# Patient Record
Sex: Male | Born: 1955 | Race: White | Hispanic: No | Marital: Married | Smoking: Former smoker
Health system: Southern US, Community
[De-identification: ages and names within clinical notes are randomized; demographics above are authoritative.]

## PROBLEM LIST (undated history)

## (undated) DIAGNOSIS — C801 Malignant (primary) neoplasm, unspecified: Secondary | ICD-10-CM

## (undated) DIAGNOSIS — C6290 Malignant neoplasm of unspecified testis, unspecified whether descended or undescended: Secondary | ICD-10-CM

## (undated) DIAGNOSIS — Z9889 Other specified postprocedural states: Secondary | ICD-10-CM

## (undated) DIAGNOSIS — F32A Depression, unspecified: Secondary | ICD-10-CM

## (undated) DIAGNOSIS — R112 Nausea with vomiting, unspecified: Secondary | ICD-10-CM

## (undated) DIAGNOSIS — K219 Gastro-esophageal reflux disease without esophagitis: Secondary | ICD-10-CM

## (undated) DIAGNOSIS — F329 Major depressive disorder, single episode, unspecified: Secondary | ICD-10-CM

## (undated) DIAGNOSIS — F419 Anxiety disorder, unspecified: Secondary | ICD-10-CM

## (undated) HISTORY — PX: BACK SURGERY: SHX140

## (undated) HISTORY — PX: JOINT REPLACEMENT: SHX530

## (undated) HISTORY — PX: OTHER SURGICAL HISTORY: SHX169

## (undated) HISTORY — DX: Malignant neoplasm of unspecified testis, unspecified whether descended or undescended: C62.90

## (undated) HISTORY — PX: KNEE ARTHROSCOPY: SUR90

## (undated) HISTORY — PX: NOSE SURGERY: SHX723

---

## 2012-06-30 ENCOUNTER — Other Ambulatory Visit: Payer: Self-pay | Admitting: Neurological Surgery

## 2012-07-13 ENCOUNTER — Encounter (HOSPITAL_COMMUNITY): Payer: Self-pay | Admitting: Pharmacy Technician

## 2012-07-19 ENCOUNTER — Ambulatory Visit (HOSPITAL_COMMUNITY)
Admission: RE | Admit: 2012-07-19 | Discharge: 2012-07-19 | Disposition: A | Discharge status: Home / Self Care | Payer: 59 | Source: Ambulatory Visit | Attending: Neurological Surgery | Admitting: Neurological Surgery

## 2012-07-19 ENCOUNTER — Encounter (HOSPITAL_COMMUNITY)
Admission: RE | Admit: 2012-07-19 | Discharge: 2012-07-19 | Disposition: A | Discharge status: Home / Self Care | Payer: 59 | Source: Ambulatory Visit | Attending: Neurological Surgery | Admitting: Neurological Surgery

## 2012-07-19 ENCOUNTER — Encounter (HOSPITAL_COMMUNITY): Payer: Self-pay

## 2012-07-19 DIAGNOSIS — Z01812 Encounter for preprocedural laboratory examination: Secondary | ICD-10-CM | POA: Insufficient documentation

## 2012-07-19 DIAGNOSIS — M47814 Spondylosis without myelopathy or radiculopathy, thoracic region: Secondary | ICD-10-CM | POA: Insufficient documentation

## 2012-07-19 DIAGNOSIS — Z01818 Encounter for other preprocedural examination: Secondary | ICD-10-CM | POA: Insufficient documentation

## 2012-07-19 HISTORY — DX: Other specified postprocedural states: Z98.890

## 2012-07-19 HISTORY — DX: Gastro-esophageal reflux disease without esophagitis: K21.9

## 2012-07-19 HISTORY — DX: Major depressive disorder, single episode, unspecified: F32.9

## 2012-07-19 HISTORY — DX: Depression, unspecified: F32.A

## 2012-07-19 HISTORY — DX: Nausea with vomiting, unspecified: R11.2

## 2012-07-19 HISTORY — DX: Malignant (primary) neoplasm, unspecified: C80.1

## 2012-07-19 HISTORY — DX: Anxiety disorder, unspecified: F41.9

## 2012-07-19 LAB — PROTIME-INR
INR: 1 (ref 0.00–1.49)
Prothrombin Time: 13 seconds (ref 11.6–15.2)

## 2012-07-19 LAB — CBC WITH DIFFERENTIAL/PLATELET
Eosinophils Absolute: 0.2 10*3/uL (ref 0.0–0.7)
Eosinophils Relative: 2 % (ref 0–5)
Hemoglobin: 12.9 g/dL — ABNORMAL LOW (ref 13.0–17.0)
Lymphocytes Relative: 29 % (ref 12–46)
Lymphs Abs: 2.7 10*3/uL (ref 0.7–4.0)
MCH: 29 pg (ref 26.0–34.0)
MCV: 83.4 fL (ref 78.0–100.0)
Monocytes Relative: 6 % (ref 3–12)
Platelets: 286 10*3/uL (ref 150–400)
RBC: 4.45 MIL/uL (ref 4.22–5.81)
WBC: 9.4 10*3/uL (ref 4.0–10.5)

## 2012-07-19 LAB — BASIC METABOLIC PANEL
CO2: 24 mEq/L (ref 19–32)
Calcium: 9 mg/dL (ref 8.4–10.5)
Chloride: 103 mEq/L (ref 96–112)
Glucose, Bld: 95 mg/dL (ref 70–99)
Potassium: 4.3 mEq/L (ref 3.5–5.1)
Sodium: 138 mEq/L (ref 135–145)

## 2012-07-19 LAB — SURGICAL PCR SCREEN: Staphylococcus aureus: NEGATIVE

## 2012-07-19 NOTE — Pre-Procedure Instructions (Signed)
Cayne Yom [facility]  07/19/2012   Your procedure is scheduled on:  07/27/12  Report to [facility] at 1054 AM.  Call this number if you have problems the morning of surgery: [phone]   Remember:   Do not eat food or drink liquids after midnight.   Take these medicines the morning of surgery with A SIP OF WATER: prozac,hydrocodone,prilosec,zoloft   Do not wear jewelry, make-up or nail polish.  Do not wear lotions, powders, or perfumes. You may wear deodorant.  Do not shave 48 hours prior to surgery. Men may shave face and neck.  Do not bring valuables to the hospital.  [facility] is not responsible                   for any belongings or valuables.  Contacts, dentures or bridgework may not be worn into surgery.  Leave suitcase in the car. After surgery it may be brought to your room.  For patients admitted to the hospital, checkout time is 11:00 AM the day of  discharge.   Patients discharged the day of surgery will not be allowed to drive  home.  Name and phone number of your driver: family  Special Instructions: Shower using CHG 2 nights before surgery and the night before surgery.  If you shower the day of surgery use CHG.  Use special wash - you have one bottle of CHG for all showers.  You should use approximately 1/3 of the bottle for each shower.   Please read over the following fact sheets that you were given: Pain Booklet, Coughing and Deep Breathing and Surgical Site Infection Prevention

## 2012-07-26 MED ORDER — DEXAMETHASONE SODIUM PHOSPHATE 10 MG/ML IJ SOLN
10.0000 mg | INTRAMUSCULAR | Status: AC
  Administered 2012-07-27: 10 mg via INTRAVENOUS
  Filled 2012-07-26: qty 1

## 2012-07-26 MED ORDER — CEFAZOLIN SODIUM-DEXTROSE 2-3 GM-% IV SOLR
2.0000 g | INTRAVENOUS | Status: AC
  Administered 2012-07-27: 2 g via INTRAVENOUS
  Filled 2012-07-26: qty 50

## 2012-07-27 ENCOUNTER — Encounter (HOSPITAL_COMMUNITY): Payer: Self-pay | Admitting: Certified Registered Nurse Anesthetist

## 2012-07-27 ENCOUNTER — Encounter (HOSPITAL_COMMUNITY): Payer: Self-pay | Admitting: *Deleted

## 2012-07-27 ENCOUNTER — Ambulatory Visit (HOSPITAL_COMMUNITY)
Admission: RE | Admit: 2012-07-27 | Discharge: 2012-07-28 | Disposition: A | Discharge status: Home / Self Care | Payer: 59 | Source: Ambulatory Visit | Attending: Neurological Surgery | Admitting: Neurological Surgery

## 2012-07-27 ENCOUNTER — Ambulatory Visit (HOSPITAL_COMMUNITY): Payer: 59

## 2012-07-27 ENCOUNTER — Encounter (HOSPITAL_COMMUNITY)
Admission: RE | Disposition: A | Discharge status: Home / Self Care | Payer: Self-pay | Source: Ambulatory Visit | Attending: Neurological Surgery

## 2012-07-27 ENCOUNTER — Ambulatory Visit (HOSPITAL_COMMUNITY): Payer: 59 | Admitting: Certified Registered Nurse Anesthetist

## 2012-07-27 DIAGNOSIS — M47812 Spondylosis without myelopathy or radiculopathy, cervical region: Secondary | ICD-10-CM | POA: Insufficient documentation

## 2012-07-27 DIAGNOSIS — M5020 Other cervical disc displacement, unspecified cervical region: Secondary | ICD-10-CM | POA: Insufficient documentation

## 2012-07-27 DIAGNOSIS — Z981 Arthrodesis status: Secondary | ICD-10-CM | POA: Insufficient documentation

## 2012-07-27 HISTORY — PX: ANTERIOR CERVICAL DECOMP/DISCECTOMY FUSION: SHX1161

## 2012-07-27 SURGERY — ANTERIOR CERVICAL DECOMPRESSION/DISCECTOMY FUSION 1 LEVEL/HARDWARE REMOVAL
Anesthesia: General | Wound class: Clean

## 2012-07-27 MED ORDER — PHENOL 1.4 % MT LIQD: 1.0000 | OROMUCOSAL | Status: DC | PRN

## 2012-07-27 MED ORDER — METHOCARBAMOL 100 MG/ML IJ SOLN
500.0000 mg | Freq: Four times a day (QID) | INTRAVENOUS | Status: DC | PRN
  Filled 2012-07-27: qty 5

## 2012-07-27 MED ORDER — EPHEDRINE SULFATE 50 MG/ML IJ SOLN
INTRAMUSCULAR | Status: DC | PRN
  Administered 2012-07-27 (×3): 5 mg via INTRAVENOUS

## 2012-07-27 MED ORDER — DEXAMETHASONE 4 MG PO TABS
4.0000 mg | ORAL_TABLET | Freq: Four times a day (QID) | ORAL | Status: DC
  Administered 2012-07-27 – 2012-07-28 (×2): 4 mg via ORAL
  Filled 2012-07-27 (×7): qty 1

## 2012-07-27 MED ORDER — ONDANSETRON HCL 4 MG/2ML IJ SOLN: 4.0000 mg | INTRAMUSCULAR | Status: DC | PRN

## 2012-07-27 MED ORDER — BUPIVACAINE HCL (PF) 0.25 % IJ SOLN
INTRAMUSCULAR | Status: DC | PRN
  Administered 2012-07-27: 2 mL

## 2012-07-27 MED ORDER — PROPOFOL 10 MG/ML IV BOLUS
INTRAVENOUS | Status: DC | PRN
  Administered 2012-07-27: 150 mg via INTRAVENOUS

## 2012-07-27 MED ORDER — ONDANSETRON HCL 4 MG/2ML IJ SOLN
INTRAMUSCULAR | Status: DC | PRN
  Administered 2012-07-27: 4 mg via INTRAVENOUS

## 2012-07-27 MED ORDER — SODIUM CHLORIDE 0.9 % IJ SOLN: 3.0000 mL | INTRAMUSCULAR | Status: DC | PRN

## 2012-07-27 MED ORDER — PROMETHAZINE HCL 25 MG/ML IJ SOLN: 6.2500 mg | INTRAMUSCULAR | Status: DC | PRN

## 2012-07-27 MED ORDER — OXYCODONE HCL 5 MG PO TABS: 5.0000 mg | ORAL_TABLET | Freq: Once | ORAL | Status: DC | PRN

## 2012-07-27 MED ORDER — DEXAMETHASONE SODIUM PHOSPHATE 4 MG/ML IJ SOLN
4.0000 mg | Freq: Four times a day (QID) | INTRAMUSCULAR | Status: DC
  Administered 2012-07-27: 4 mg via INTRAVENOUS
  Filled 2012-07-27 (×5): qty 1

## 2012-07-27 MED ORDER — METHOCARBAMOL 500 MG PO TABS
500.0000 mg | ORAL_TABLET | Freq: Four times a day (QID) | ORAL | Status: DC | PRN
  Administered 2012-07-27: 500 mg via ORAL

## 2012-07-27 MED ORDER — ARTIFICIAL TEARS OP OINT
TOPICAL_OINTMENT | OPHTHALMIC | Status: DC | PRN
  Administered 2012-07-27: 1 via OPHTHALMIC

## 2012-07-27 MED ORDER — PHENYLEPHRINE HCL 10 MG/ML IJ SOLN
INTRAMUSCULAR | Status: DC | PRN
  Administered 2012-07-27 (×5): 80 ug via INTRAVENOUS

## 2012-07-27 MED ORDER — MENTHOL 3 MG MT LOZG: 1.0000 | LOZENGE | OROMUCOSAL | Status: DC | PRN

## 2012-07-27 MED ORDER — BACITRACIN 50000 UNITS IM SOLR
INTRAMUSCULAR | Status: AC
  Filled 2012-07-27: qty 1

## 2012-07-27 MED ORDER — HYDROMORPHONE HCL PF 1 MG/ML IJ SOLN
INTRAMUSCULAR | Status: AC
  Administered 2012-07-27: 1 mg
  Filled 2012-07-27: qty 1

## 2012-07-27 MED ORDER — FLUOXETINE HCL 20 MG PO CAPS
20.0000 mg | ORAL_CAPSULE | Freq: Every day | ORAL | Status: DC
  Filled 2012-07-27 (×2): qty 1

## 2012-07-27 MED ORDER — LIDOCAINE HCL (CARDIAC) 20 MG/ML IV SOLN
INTRAVENOUS | Status: DC | PRN
  Administered 2012-07-27: 100 mg via INTRAVENOUS

## 2012-07-27 MED ORDER — SODIUM CHLORIDE 0.9 % IJ SOLN
3.0000 mL | Freq: Two times a day (BID) | INTRAMUSCULAR | Status: DC
  Administered 2012-07-27: 3 mL via INTRAVENOUS

## 2012-07-27 MED ORDER — THROMBIN 5000 UNITS EX SOLR
CUTANEOUS | Status: DC | PRN
  Administered 2012-07-27 (×3): 5000 [IU] via TOPICAL

## 2012-07-27 MED ORDER — POTASSIUM CHLORIDE IN NACL 20-0.9 MEQ/L-% IV SOLN
INTRAVENOUS | Status: DC
  Administered 2012-07-27: 18:00:00 via INTRAVENOUS
  Filled 2012-07-27 (×3): qty 1000

## 2012-07-27 MED ORDER — FENTANYL CITRATE 0.05 MG/ML IJ SOLN
INTRAMUSCULAR | Status: DC | PRN
  Administered 2012-07-27 (×2): 50 ug via INTRAVENOUS
  Administered 2012-07-27: 100 ug via INTRAVENOUS
  Administered 2012-07-27: 150 ug via INTRAVENOUS
  Administered 2012-07-27: 50 ug via INTRAVENOUS

## 2012-07-27 MED ORDER — MORPHINE SULFATE 2 MG/ML IJ SOLN: 1.0000 mg | INTRAMUSCULAR | Status: DC | PRN

## 2012-07-27 MED ORDER — METHOCARBAMOL 500 MG PO TABS
ORAL_TABLET | ORAL | Status: AC
  Filled 2012-07-27: qty 1

## 2012-07-27 MED ORDER — SODIUM CHLORIDE 0.9 % IR SOLN
Status: DC | PRN
  Administered 2012-07-27: 13:00:00

## 2012-07-27 MED ORDER — GLYCOPYRROLATE 0.2 MG/ML IJ SOLN
INTRAMUSCULAR | Status: DC | PRN
  Administered 2012-07-27: 0.6 mg via INTRAVENOUS

## 2012-07-27 MED ORDER — LIDOCAINE HCL 4 % MT SOLN
OROMUCOSAL | Status: DC | PRN
  Administered 2012-07-27: 4 mL via TOPICAL

## 2012-07-27 MED ORDER — MIDAZOLAM HCL 5 MG/5ML IJ SOLN
INTRAMUSCULAR | Status: DC | PRN
  Administered 2012-07-27: 2 mg via INTRAVENOUS

## 2012-07-27 MED ORDER — NEOSTIGMINE METHYLSULFATE 1 MG/ML IJ SOLN
INTRAMUSCULAR | Status: DC | PRN
  Administered 2012-07-27: 4 mg via INTRAVENOUS

## 2012-07-27 MED ORDER — HEMOSTATIC AGENTS (NO CHARGE) OPTIME
TOPICAL | Status: DC | PRN
  Administered 2012-07-27: 1 via TOPICAL

## 2012-07-27 MED ORDER — LACTATED RINGERS IV SOLN
INTRAVENOUS | Status: DC | PRN
  Administered 2012-07-27 (×2): via INTRAVENOUS

## 2012-07-27 MED ORDER — ACETAMINOPHEN 650 MG RE SUPP: 650.0000 mg | RECTAL | Status: DC | PRN

## 2012-07-27 MED ORDER — HYDROCODONE-ACETAMINOPHEN 5-325 MG PO TABS
1.0000 | ORAL_TABLET | ORAL | Status: DC | PRN
  Administered 2012-07-27: 2 via ORAL

## 2012-07-27 MED ORDER — HYDROMORPHONE HCL PF 1 MG/ML IJ SOLN: 0.2500 mg | INTRAMUSCULAR | Status: DC | PRN

## 2012-07-27 MED ORDER — ALBUTEROL SULFATE HFA 108 (90 BASE) MCG/ACT IN AERS
INHALATION_SPRAY | RESPIRATORY_TRACT | Status: DC | PRN
  Administered 2012-07-27: 6 via RESPIRATORY_TRACT

## 2012-07-27 MED ORDER — VECURONIUM BROMIDE 10 MG IV SOLR
INTRAVENOUS | Status: DC | PRN
  Administered 2012-07-27: 3 mg via INTRAVENOUS
  Administered 2012-07-27: 2 mg via INTRAVENOUS

## 2012-07-27 MED ORDER — SERTRALINE HCL 100 MG PO TABS
100.0000 mg | ORAL_TABLET | Freq: Every day | ORAL | Status: DC
  Filled 2012-07-27 (×2): qty 1

## 2012-07-27 MED ORDER — ROCURONIUM BROMIDE 100 MG/10ML IV SOLN
INTRAVENOUS | Status: DC | PRN
  Administered 2012-07-27: 50 mg via INTRAVENOUS

## 2012-07-27 MED ORDER — ACETAMINOPHEN 325 MG PO TABS: 650.0000 mg | ORAL_TABLET | ORAL | Status: DC | PRN

## 2012-07-27 MED ORDER — CEFAZOLIN SODIUM 1-5 GM-% IV SOLN
1.0000 g | Freq: Three times a day (TID) | INTRAVENOUS | Status: AC
  Administered 2012-07-27 – 2012-07-28 (×2): 1 g via INTRAVENOUS
  Filled 2012-07-27 (×2): qty 50

## 2012-07-27 MED ORDER — HYDROCODONE-ACETAMINOPHEN 5-325 MG PO TABS
ORAL_TABLET | ORAL | Status: AC
  Filled 2012-07-27: qty 2

## 2012-07-27 MED ORDER — SODIUM CHLORIDE 0.9 % IV SOLN
INTRAVENOUS | Status: AC
  Filled 2012-07-27: qty 500

## 2012-07-27 MED ORDER — 0.9 % SODIUM CHLORIDE (POUR BTL) OPTIME
TOPICAL | Status: DC | PRN
  Administered 2012-07-27: 1000 mL

## 2012-07-27 MED ORDER — OXYCODONE HCL 5 MG/5ML PO SOLN: 5.0000 mg | Freq: Once | ORAL | Status: DC | PRN

## 2012-07-27 SURGICAL SUPPLY — 51 items
BAG DECANTER FOR FLEXI CONT (MISCELLANEOUS) ×2 IMPLANT
BENZOIN TINCTURE PRP APPL 2/3 (GAUZE/BANDAGES/DRESSINGS) ×2 IMPLANT
BUR MATCHSTICK NEURO 3.0 LAGG (BURR) ×2 IMPLANT
CAGE LORDOTIC 8 SM PLUS (Cage) ×2 IMPLANT
CANISTER SUCTION 2500CC (MISCELLANEOUS) ×2 IMPLANT
CLOTH BEACON ORANGE TIMEOUT ST (SAFETY) ×2 IMPLANT
CONT SPEC 4OZ CLIKSEAL STRL BL (MISCELLANEOUS) ×2 IMPLANT
DRAPE C-ARM 42X72 X-RAY (DRAPES) ×4 IMPLANT
DRAPE LAPAROTOMY 100X72 PEDS (DRAPES) ×2 IMPLANT
DRAPE MICROSCOPE ZEISS OPMI (DRAPES) ×2 IMPLANT
DRAPE POUCH INSTRU U-SHP 10X18 (DRAPES) ×2 IMPLANT
DRESSING TELFA 8X3 (GAUZE/BANDAGES/DRESSINGS) ×2 IMPLANT
DRILL BIT HELIX (BIT) ×2 IMPLANT
DRSG OPSITE 4X5.5 SM (GAUZE/BANDAGES/DRESSINGS) ×2 IMPLANT
DURAPREP 6ML APPLICATOR 50/CS (WOUND CARE) ×2 IMPLANT
ELECT COATED BLADE 2.86 ST (ELECTRODE) ×2 IMPLANT
ELECT REM PT RETURN 9FT ADLT (ELECTROSURGICAL) ×2
ELECTRODE REM PT RTRN 9FT ADLT (ELECTROSURGICAL) ×1 IMPLANT
GAUZE SPONGE 4X4 16PLY XRAY LF (GAUZE/BANDAGES/DRESSINGS) IMPLANT
GLOVE BIO SURGEON STRL SZ8 (GLOVE) ×2 IMPLANT
GLOVE ECLIPSE 7.5 STRL STRAW (GLOVE) ×2 IMPLANT
GLOVE INDICATOR 7.0 STRL GRN (GLOVE) ×2 IMPLANT
GLOVE INDICATOR 8.0 STRL GRN (GLOVE) ×2 IMPLANT
GLOVE OPTIFIT SS 6.5 STRL BRWN (GLOVE) ×4 IMPLANT
GOWN BRE IMP SLV AUR LG STRL (GOWN DISPOSABLE) ×2 IMPLANT
GOWN BRE IMP SLV AUR XL STRL (GOWN DISPOSABLE) ×2 IMPLANT
GOWN STRL REIN 2XL LVL4 (GOWN DISPOSABLE) IMPLANT
HEAD HALTER (SOFTGOODS) IMPLANT
HEMOSTAT POWDER KIT SURGIFOAM (HEMOSTASIS) ×2 IMPLANT
KIT BASIN OR (CUSTOM PROCEDURE TRAY) ×2 IMPLANT
KIT ROOM TURNOVER OR (KITS) ×2 IMPLANT
NEEDLE HYPO 25X1 1.5 SAFETY (NEEDLE) ×2 IMPLANT
NEEDLE SPNL 20GX3.5 QUINCKE YW (NEEDLE) ×2 IMPLANT
NS IRRIG 1000ML POUR BTL (IV SOLUTION) ×2 IMPLANT
PACK LAMINECTOMY NEURO (CUSTOM PROCEDURE TRAY) ×2 IMPLANT
PAD ARMBOARD 7.5X6 YLW CONV (MISCELLANEOUS) ×6 IMPLANT
PIN DISTRACTION 14MM (PIN) ×4 IMPLANT
PLATE HELIX-R 24MM (Plate) ×2 IMPLANT
PUTTY ABX ACTIFUSE 1.5ML (Putty) ×2 IMPLANT
RUBBERBAND STERILE (MISCELLANEOUS) ×4 IMPLANT
SCREW HELIX 4.0X13 (Screw) ×3 IMPLANT
SCREW HELIX 4.0X13MM (Screw) ×6 IMPLANT
SCREW VARIABLE 4.5X13 (Screw) ×2 IMPLANT
SPONGE INTESTINAL PEANUT (DISPOSABLE) ×2 IMPLANT
STRIP CLOSURE SKIN 1/2X4 (GAUZE/BANDAGES/DRESSINGS) ×2 IMPLANT
SUT VIC AB 3-0 SH 8-18 (SUTURE) ×4 IMPLANT
SYR 20ML ECCENTRIC (SYRINGE) ×2 IMPLANT
TOWEL OR 17X24 6PK STRL BLUE (TOWEL DISPOSABLE) ×2 IMPLANT
TOWEL OR 17X26 10 PK STRL BLUE (TOWEL DISPOSABLE) ×2 IMPLANT
TRAP SPECIMEN MUCOUS 40CC (MISCELLANEOUS) ×2 IMPLANT
WATER STERILE IRR 1000ML POUR (IV SOLUTION) ×2 IMPLANT

## 2012-07-27 NOTE — Preoperative (Signed)
Beta Blockers   Reason not to administer Beta Blockers:Not Applicable 

## 2012-07-27 NOTE — Plan of Care (Signed)
Problem: Consults Goal: Diagnosis - Spinal Surgery Outcome: Completed/Met Date Met:  07/27/12 Cervical Spine Fusion

## 2012-07-27 NOTE — Anesthesia Postprocedure Evaluation (Signed)
  Anesthesia Post-op Note  Patient: Aaron Jimenez [name]  Procedure(s) Performed: Procedure(s) with comments: ANTERIOR CERVICAL DECOMPRESSION/DISCECTOMY FUSION CERVICALSIX-SEVEN1 LEVEL/HARDWARE REMOVAL CERVICAL FIVE-SIX (N/A) - Anterior Cervical Decompression/Discectomy Fusion, Cervical Six-Seven Fusion, Removal of hardware at Cervical Five-Six  Patient Location: PACU  Anesthesia Type:General  Level of Consciousness: awake and alert   Airway and Oxygen Therapy: Patient Spontanous Breathing  Post-op Pain: mild  Post-op Assessment: Post-op Vital signs reviewed, Patient's Cardiovascular Status Stable, Respiratory Function Stable, Patent Airway, No signs of Nausea or vomiting and Pain level controlled  Post-op Vital Signs: stable  Complications: No apparent anesthesia complications

## 2012-07-27 NOTE — Transfer of Care (Signed)
Immediate Anesthesia Transfer of Care Note  Patient: Tren[facility]  Procedure(s) Performed: Procedure(s) with comments: ANTERIOR CERVICAL DECOMPRESSION/DISCECTOMY FUSION CERVICALSIX-SEVEN1 LEVEL/HARDWARE REMOVAL CERVICAL FIVE-SIX (N/A) - Anterior Cervical Decompression/Discectomy Fusion, Cervical Six-Seven Fusion, Removal of hardware at Cervical Five-Six  Patient Location: PACU  Anesthesia Type:General  Level of Consciousness: awake, alert , oriented and patient cooperative  Airway & Oxygen Therapy: Patient Spontanous Breathing and Patient connected to face mask oxygen  Post-op Assessment: Report given to PACU RN, Post -op Vital signs reviewed and stable and Patient moving all extremities X 4  Post vital signs: Reviewed and stable  Complications: No apparent anesthesia complications

## 2012-07-27 NOTE — H&P (Signed)
Subjective:   Patient is a 57 y.o. male admitted for ACDF. The patient first presented to me with complaints of neck pain and shooting pains in the arm(s). Onset of symptoms was several months ago. The pain is described as aching and occurs all day. The pain is rated severe, and is located at the base of the neck and radiates to the arm. The symptoms have been progressive. Symptoms are exacerbated by extending head backwards, and are relieved by none.  Previous work up includes MRI of cervical spine, results: spinal stenosis.  Past Medical History  Diagnosis Date  . Depression   . Anxiety   . PONV (postoperative nausea and vomiting)   . GERD (gastroesophageal reflux disease)   . Cancer     tumor removed off lt testicle    Past Surgical History  Procedure Laterality Date  . Joint replacement      bil knee scope  . Back surgery      neck fusion    No Known Allergies  History  Substance Use Topics  . Smoking status: Former Games developer  . Smokeless tobacco: Not on file  . Alcohol Use: No    History reviewed. No pertinent family history. Prior to Admission medications   Medication Sig Start Date End Date Taking? Authorizing Provider  FLUoxetine (PROZAC) 20 MG capsule Take 20 mg by mouth daily.   Yes Historical Provider, MD  HYDROcodone-acetaminophen (NORCO/VICODIN) 5-325 MG per tablet Take 1 tablet by mouth every 4 (four) hours as needed for pain.   Yes Historical Provider, MD  omeprazole (PRILOSEC) 40 MG capsule Take 40 mg by mouth daily. Take 1-2 capsules a day.   Yes Historical Provider, MD  sertraline (ZOLOFT) 100 MG tablet Take 100 mg by mouth daily.   Yes Historical Provider, MD     Review of Systems  Positive ROS: neg  All other systems have been reviewed and were otherwise negative with the exception of those mentioned in the HPI and as above.  Objective: Vital signs in last 24 hours: Temp:  [98.2 F (36.8 C)] 98.2 F (36.8 C) (07/10 0916) Pulse Rate:  [69] 69 (07/10  0916) Resp:  [20] 20 (07/10 0916) BP: (150)/(95) 150/95 mmHg (07/10 0916) SpO2:  [98 %] 98 % (07/10 0916)  General Appearance: Alert, cooperative, no distress, appears stated age Head: Normocephalic, without obvious abnormality, atraumatic Eyes: PERRL, conjunctiva/corneas clear, EOM's intact, fundi benign, both eyes      Ears: Normal TM's and external ear canals, both ears Throat: Lips, mucosa, and tongue normal; teeth and gums normal Neck: Supple, symmetrical, trachea midline, no adenopathy; thyroid: No enlargement/tenderness/nodules; no carotid bruit or JVD Back: Symmetric, no curvature, ROM normal, no CVA tenderness Lungs: Clear to auscultation bilaterally, respirations unlabored Heart: Regular rate and rhythm, S1 and S2 normal, no murmur, rub or gallop Abdomen: Soft, non-tender, bowel sounds active all four quadrants, no masses, no organomegaly Extremities: Extremities normal, atraumatic, no cyanosis or edema Pulses: 2+ and symmetric all extremities Skin: Skin color, texture, turgor normal, no rashes or lesions  NEUROLOGIC:  Mental status: Alert and oriented x4, no aphasia, good attention span, fund of knowledge and memory  Motor Exam - grossly normal Sensory Exam - grossly normal Reflexes: 1+ Coordination - grossly normal Gait - grossly normal Balance - grossly normal Cranial Nerves: I: smell Not tested  II: visual acuity  OS: nl    OD: nl  II: visual fields Full to confrontation  II: pupils Equal, round, reactive to light  III,VII: ptosis None  III,IV,VI: extraocular muscles  Full ROM  V: mastication Normal  V: facial light touch sensation  Normal  V,VII: corneal reflex  Present  VII: facial muscle function - upper  Normal  VII: facial muscle function - lower Normal  VIII: hearing Not tested  IX: soft palate elevation  Normal  IX,X: gag reflex Present  XI: trapezius strength  5/5  XI: sternocleidomastoid strength 5/5  XI: neck flexion strength  5/5  XII: tongue  strength  Normal    Data Review Lab Results  Component Value Date   WBC 9.4 07/19/2012   HGB 12.9* 07/19/2012   HCT 37.1* 07/19/2012   MCV 83.4 07/19/2012   PLT 286 07/19/2012   Lab Results  Component Value Date   NA 138 07/19/2012   K 4.3 07/19/2012   CL 103 07/19/2012   CO2 24 07/19/2012   BUN 14 07/19/2012   CREATININE 1.06 07/19/2012   GLUCOSE 95 07/19/2012   Lab Results  Component Value Date   INR 1.00 07/19/2012    Assessment:   Cervical neck pain with herniated nucleus pulposus/ spondylosis/ stenosis at C5-6 C6-7. Patient has failed conservative therapy. Planned surgery : ACDF  Plan:   I explained the condition and procedure to the patient and answered any questions.  Patient wishes to proceed with procedure as planned. Understands risks/ benefits/ and expected or typical outcomes.  Aaron Jimenez S 07/27/2012 1:03 PM

## 2012-07-27 NOTE — Anesthesia Preprocedure Evaluation (Signed)
Anesthesia Evaluation  Patient identified by MRN, date of birth, ID band Patient awake    Reviewed: Allergy & Precautions, H&P , NPO status , Patient's Chart, lab work & pertinent test results  History of Anesthesia Complications (+) PONV  Airway Mallampati: II TM Distance: >3 FB Neck ROM: Limited    Dental   Pulmonary former smoker,  breath sounds clear to auscultation        Cardiovascular Rhythm:Regular Rate:Normal     Neuro/Psych Anxiety Depression    GI/Hepatic GERD-  ,  Endo/Other    Renal/GU      Musculoskeletal   Abdominal (+) + obese,   Peds  Hematology   Anesthesia Other Findings   Reproductive/Obstetrics                           Anesthesia Physical Anesthesia Plan  ASA: II  Anesthesia Plan: General   Post-op Pain Management:    Induction: Intravenous  Airway Management Planned: Oral ETT  Additional Equipment:   Intra-op Plan:   Post-operative Plan: Extubation in OR  Informed Consent: I have reviewed the patients History and Physical, chart, labs and discussed the procedure including the risks, benefits and alternatives for the proposed anesthesia with the patient or authorized representative who has indicated his/her understanding and acceptance.     Plan Discussed with: CRNA and Surgeon  Anesthesia Plan Comments:         Anesthesia Quick Evaluation

## 2012-07-27 NOTE — Op Note (Signed)
07/27/2012  3:33 PM  PATIENT:  Aaron Jimenez  57 y.o. 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 Cervical reexploration with removal of anterior cervical plate Z6-1, 2. Decompressive anterior cervical discectomy C6-7, 3. Anterior cervical arthrodesis C6-7 utilizing an 8 mm peek interbody cage packed with local autograft and morcellized allograft, 4. Anterior cervical plating C6-7 utilizing a helix plate  SURGEON:  Marikay Alar, MD  ASSISTANTS: Dr. Newell Coral  ANESTHESIA:   General  EBL: 50 ml  Total I/O In: 1000 [I.V.:1000] Out: 50 [Blood:50]  BLOOD ADMINISTERED:none  DRAINS: None   SPECIMEN:  No Specimen  INDICATION FOR PROCEDURE: This patient presented with severe left arm pain. Undergone a previous ACDF many years ago at C5-6. Imaging showed a large left-sided disc herniation at C6-7. He tried medical management without relief. I recommended anterior cervical discectomy fusion plating at C6-7. Patient understood the risks, benefits, and alternatives and potential outcomes and wished to proceed.  PROCEDURE DETAILS: Patient was brought to the operating room placed under general endotracheal anesthesia. Patient was placed in the supine position on the operating room table. The neck was prepped with Duraprep and draped in a sterile fashion.   Three cc of local anesthesia was injected and a transverse incision was made on the right side of the neck.  Dissection was carried down thru the subcutaneous tissue and the platysma was  elevated, opened, and undermined with Metzenbaum scissors.  Dissection was then carried out thru an avascular plane leaving the sternocleidomastoid carotid artery and jugular vein laterally and the trachea and esophagus medially. The ventral aspect of the vertebral column was identified and a localizing x-ray was taken. The the C5-6  plate and the W9-6 level was identified. I removed the soft tissue with blunt dissection from the surface of the C5-6 plate. I loosened the locking mechanism and then removed the 4 screws and then removed the plate. I then waxed the holes. The longus colli muscles were then elevated and the retractor was placed to expose C6-7. The annulus was incised and the disc space entered. Discectomy was performed with micro-curettes and pituitary rongeurs. I then used the high-speed drill to drill the endplates down to the level of the posterior longitudinal ligament. The drill shavings were saved in a mucous trap for later arthrodesis. The operating microscope was draped and brought into the field provided additional magnification, illumination and visualization. Discectomy was continued posteriorly thru the disc space. Posterior longitudinal ligament was opened with a nerve hook, and then removed along with disc herniation and osteophytes, decompressing the spinal canal and thecal sac. We then continued to remove osteophytic overgrowth and disc material decompressing the neural foramina and exiting nerve roots bilaterally. The scope was angled up and down to help decompress and undercut the vertebral bodies. Once the decompression was completed we could pass a nerve hook circumferentially to assure adequate decompression in the midline and in the neural foramina. So by both visualization and palpation we felt we had an adequate decompression of the neural elements. We then measured the height of the intravertebral disc space and selected a 8 millimeter Peek interbody cage packed with autograft and morcellized allograft. It was then gently positioned in the intravertebral disc space and countersunk. I then used a helix plate and placed four variable angle screws into the vertebral bodies and locked them into position. The wound was irrigated  with bacitracin solution, checked for hemostasis which was established and confirmed.  Once meticulous hemostasis was achieved, we then proceeded with closure. The platysma was closed with interrupted 3-0 undyed Vicryl suture, the subcuticular layer was closed with interrupted 3-0 undyed Vicryl suture. The skin edges were approximated with steristrips. The drapes were removed. A sterile dressing was applied. The patient was then awakened from general anesthesia and transferred to the recovery room in stable condition. At the end of the procedure all sponge, needle and instrument counts were correct.   PLAN OF CARE: Admit for overnight observation  PATIENT DISPOSITION:  PACU - hemodynamically stable.   Delay start of Pharmacological VTE agent (>24hrs) due to surgical blood loss or risk of bleeding:  yes

## 2012-07-28 NOTE — Discharge Summary (Signed)
Physician Discharge Summary  Patient ID: Aaron Jimenez MRN: [id number] DOB/AGE: [birth year] 59 y.o.  Admit date: 07/27/2012 Discharge date: 07/28/2012  Admission Diagnoses: Spondylosis with disc herniation    Discharge Diagnoses: Same   Discharged Condition: good  Hospital Course: The patient was admitted on 07/27/2012 and taken to the operating room where the patient underwent ACDF C6-7. The patient tolerated the procedure well and was taken to the recovery room and then to the floor in stable condition. The hospital course was routine. There were no complications. The wound remained clean dry and intact. Pt had appropriate neck soreness. No complaints of arm pain or new N/T/W. The patient remained afebrile with stable vital signs, and tolerated a regular diet. The patient continued to increase activities, and pain was well controlled with oral pain medications.   Consults: None  Significant Diagnostic Studies:  Results for orders placed during the hospital encounter of 07/19/12  SURGICAL PCR SCREEN      Result Value Range   MRSA, PCR NEGATIVE  NEGATIVE   Staphylococcus aureus NEGATIVE  NEGATIVE  BASIC METABOLIC PANEL      Result Value Range   Sodium 138  135 - 145 mEq/L   Potassium 4.3  3.5 - 5.1 mEq/L   Chloride 103  96 - 112 mEq/L   CO2 24  19 - 32 mEq/L   Glucose, Bld 95  70 - 99 mg/dL   BUN 14  6 - 23 mg/dL   Creatinine, Ser 4.09  0.50 - 1.35 mg/dL   Calcium 9.0  8.4 - 81.1 mg/dL   GFR calc non Af Amer 76 (*) >90 mL/min   GFR calc Af Amer 88 (*) >90 mL/min  CBC WITH DIFFERENTIAL      Result Value Range   WBC 9.4  4.0 - 10.5 K/uL   RBC 4.45  4.22 - 5.81 MIL/uL   Hemoglobin 12.9 (*) 13.0 - 17.0 g/dL   HCT 91.4 (*) 78.2 - 95.6 %   MCV 83.4  78.0 - 100.0 fL   MCH 29.0  26.0 - 34.0 pg   MCHC 34.8  30.0 - 36.0 g/dL   RDW 21.3  08.6 - 57.8 %   Platelets 286  150 - 400 K/uL   Neutrophils Relative % 62  43 - 77 %   Neutro Abs 5.9  1.7 - 7.7 K/uL   Lymphocytes Relative 29   12 - 46 %   Lymphs Abs 2.7  0.7 - 4.0 K/uL   Monocytes Relative 6  3 - 12 %   Monocytes Absolute 0.6  0.1 - 1.0 K/uL   Eosinophils Relative 2  0 - 5 %   Eosinophils Absolute 0.2  0.0 - 0.7 K/uL   Basophils Relative 1  0 - 1 %   Basophils Absolute 0.1  0.0 - 0.1 K/uL  PROTIME-INR      Result Value Range   Prothrombin Time 13.0  11.6 - 15.2 seconds   INR 1.00  0.00 - 1.49    Chest 2 View  07/19/2012   *RADIOLOGY REPORT*  Clinical Data: Preop for back surgery, spondylosis  CHEST - 2 VIEW  Comparison: None.  Findings: Cardiomediastinal silhouette is unremarkable.  No acute infiltrate or pleural effusion.  No pulmonary edema.  Degenerative changes are noted mid thoracic spine.  IMPRESSION: No active disease. Degenerative changes mid thoracic spine.   Original Report Authenticated By: Natasha Mead, M.D.   Dg Cervical Spine 1 View  07/27/2012   *RADIOLOGY REPORT*  Clinical Data: Neck pain.  Prior fusion.  DG C-ARM 1-60 MIN,DG CERVICAL SPINE - 1 VIEW  Technique:  C-arm fluoroscopic images were obtained intraoperatively and submitted for postoperative interpretation. Please see the performing provider's procedural report for the fluoroscopy time utilized.  Comparison: CT cervical spine 04/26/2012.  Findings: Hardware at C5-6 has been removed.  C6-7 ACDF has been performed.  Within limits of visualization on these C-arm images, satisfactory position and alignment.  IMPRESSION: As above.   Original Report Authenticated By: Davonna Belling, M.D.   Dg C-arm 1-60 Min  07/27/2012   *RADIOLOGY REPORT*  Clinical Data: Neck pain.  Prior fusion.  DG C-ARM 1-60 MIN,DG CERVICAL SPINE - 1 VIEW  Technique:  C-arm fluoroscopic images were obtained intraoperatively and submitted for postoperative interpretation. Please see the performing provider's procedural report for the fluoroscopy time utilized.  Comparison: CT cervical spine 04/26/2012.  Findings: Hardware at C5-6 has been removed.  C6-7 ACDF has been performed.  Within  limits of visualization on these C-arm images, satisfactory position and alignment.  IMPRESSION: As above.   Original Report Authenticated By: Davonna Belling, M.D.    Antibiotics:  Anti-infectives   Start     Dose/Rate Route Frequency Ordered Stop   07/27/12 2100  ceFAZolin (ANCEF) IVPB 1 g/50 mL premix     1 g 100 mL/hr over 30 Minutes Intravenous Every 8 hours 07/27/12 1645 07/28/12 0507   07/27/12 1321  bacitracin 50,000 Units in sodium chloride irrigation 0.9 % 500 mL irrigation  Status:  Discontinued       As needed 07/27/12 1411 07/27/12 1539   07/27/12 1307  bacitracin 54098 UNITS injection    Comments:  RATCLIFF, ESTHER: cabinet override      07/27/12 1307 07/28/12 0114   07/27/12 0600  ceFAZolin (ANCEF) IVPB 2 g/50 mL premix     2 g 100 mL/hr over 30 Minutes Intravenous On call to O.R. 07/26/12 1404 07/27/12 1348      Discharge Exam: Blood pressure 125/82, pulse 85, temperature 97.7 F (36.5 C), temperature source Oral, resp. rate 18, SpO2 99.00%. Neurologic: Grossly normal Incision clean dry and intact  Discharge Medications:     Medication List         FLUoxetine 20 MG capsule  Commonly known as:  PROZAC  Take 20 mg by mouth daily.     HYDROcodone-acetaminophen 5-325 MG per tablet  Commonly known as:  NORCO/VICODIN  Take 1 tablet by mouth every 4 (four) hours as needed for pain.     omeprazole 40 MG capsule  Commonly known as:  PRILOSEC  Take 40 mg by mouth daily. Take 1-2 capsules a day.     sertraline 100 MG tablet  Commonly known as:  ZOLOFT  Take 100 mg by mouth daily.        Disposition: Home   Final Dx: ACDF with plate J1-9      Discharge Orders   Future Orders Complete By Expires     Call MD for:  difficulty breathing, headache or visual disturbances  As directed     Call MD for:  persistant nausea and vomiting  As directed     Call MD for:  redness, tenderness, or signs of infection (pain, swelling, redness, odor or green/yellow discharge  around incision site)  As directed     Call MD for:  severe uncontrolled pain  As directed     Call MD for:  temperature >100.4  As directed     Diet -  low sodium heart healthy  As directed     Discharge instructions  As directed     Comments:      No driving for 1 week, no heavy lifting, no strenuous activity    Increase activity slowly  As directed     Remove dressing in 24 hours  As directed        Follow-up Information   Follow up with JONES,DAVID S, MD In 2 weeks.   Contact information:   [address] [place] [address] [phone]        Signed: Tia Alert 07/28/2012, 7:54 AM

## 2012-07-28 NOTE — Progress Notes (Signed)
Pt given D/C instructions with verbal understanding. Pt D/C'd home via wheelchair @ 1020 per MD order. Rema Fendt, RN

## 2012-08-01 ENCOUNTER — Encounter (HOSPITAL_COMMUNITY): Payer: Self-pay | Admitting: Neurological Surgery

## 2014-06-17 IMAGING — CR DG CHEST 2V
2 series · 2 of 2 positions shown · non-contrast
Comparison: None.

CLINICAL DATA: Preop for back surgery, spondylosis

CHEST - 2 VIEW

[w chest pa]
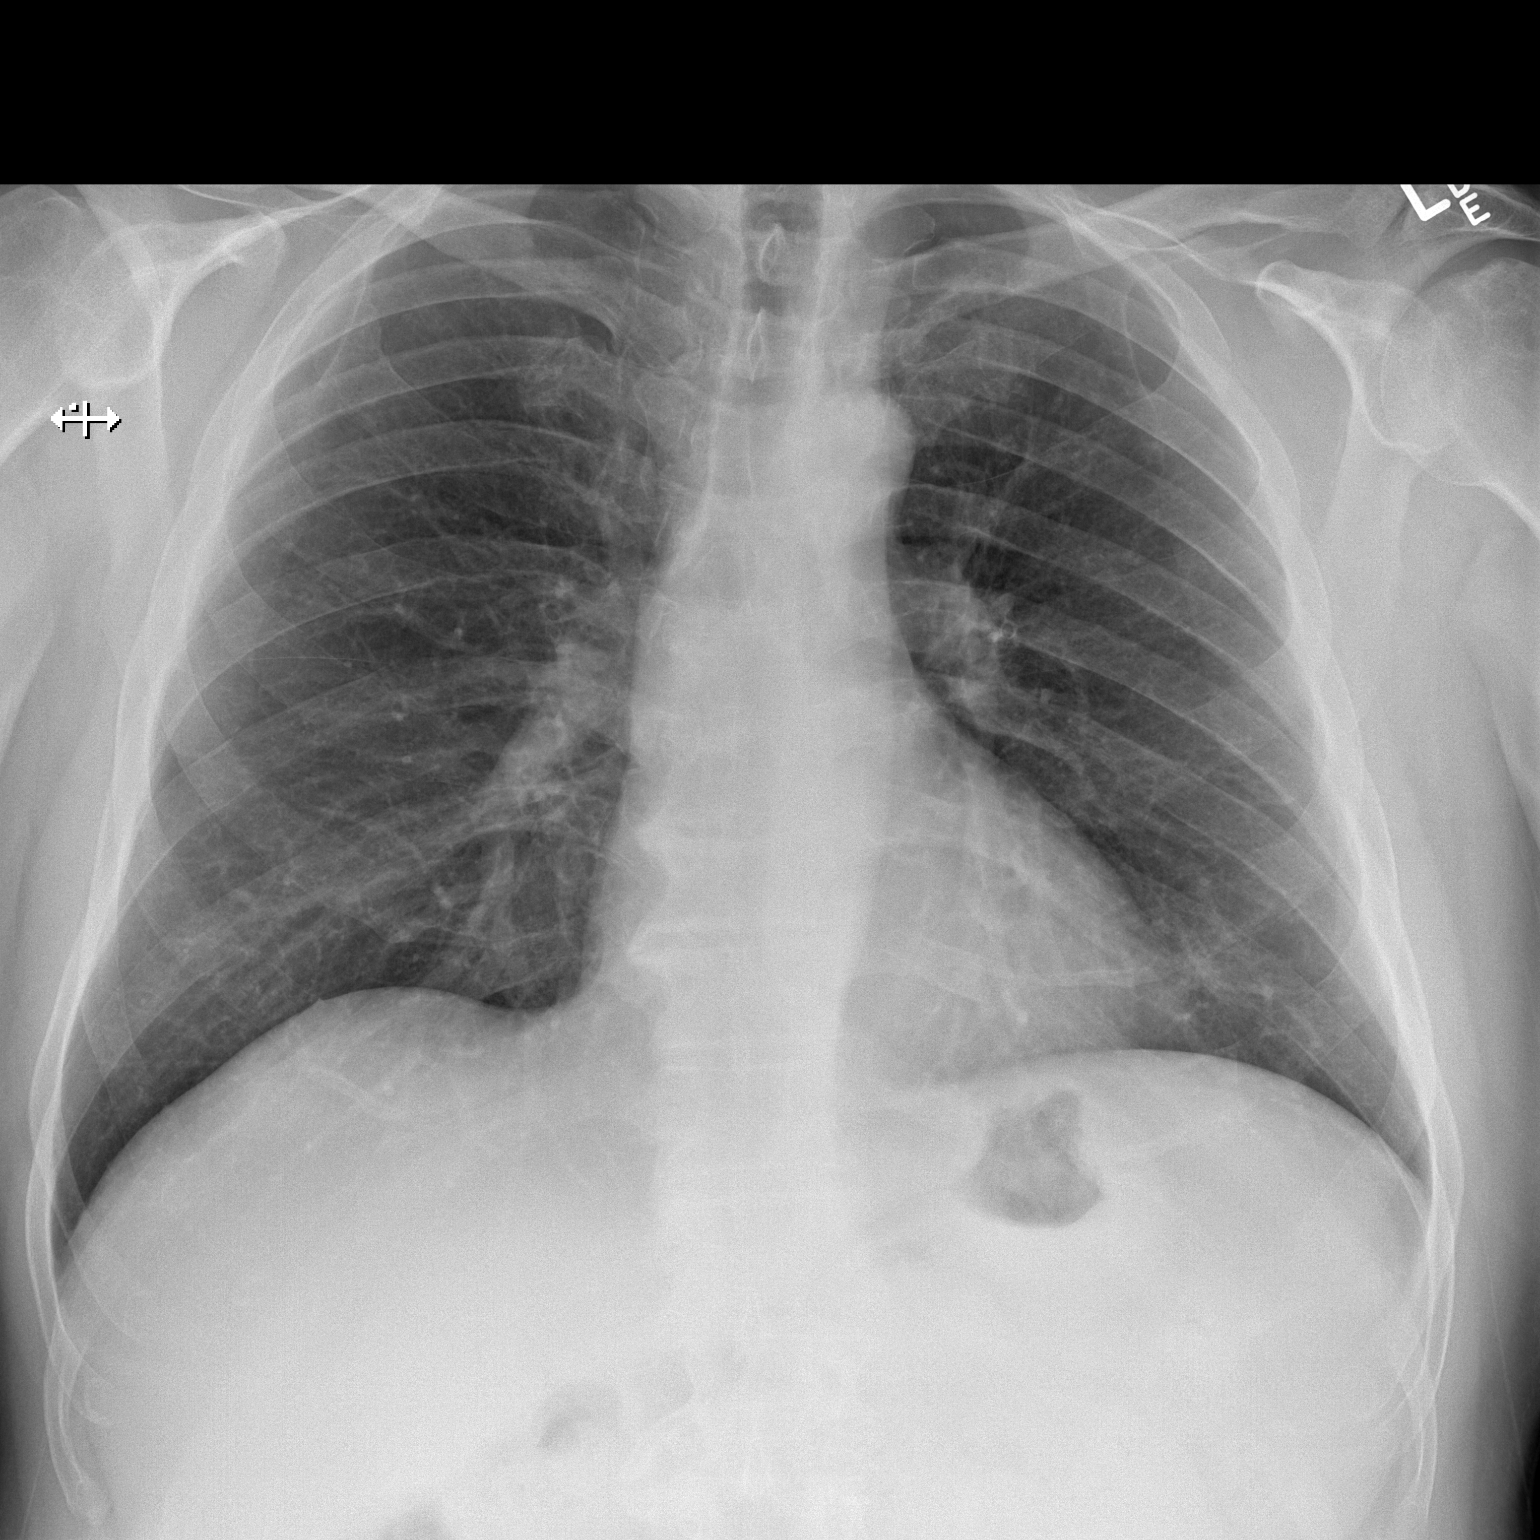

[w chest lat]
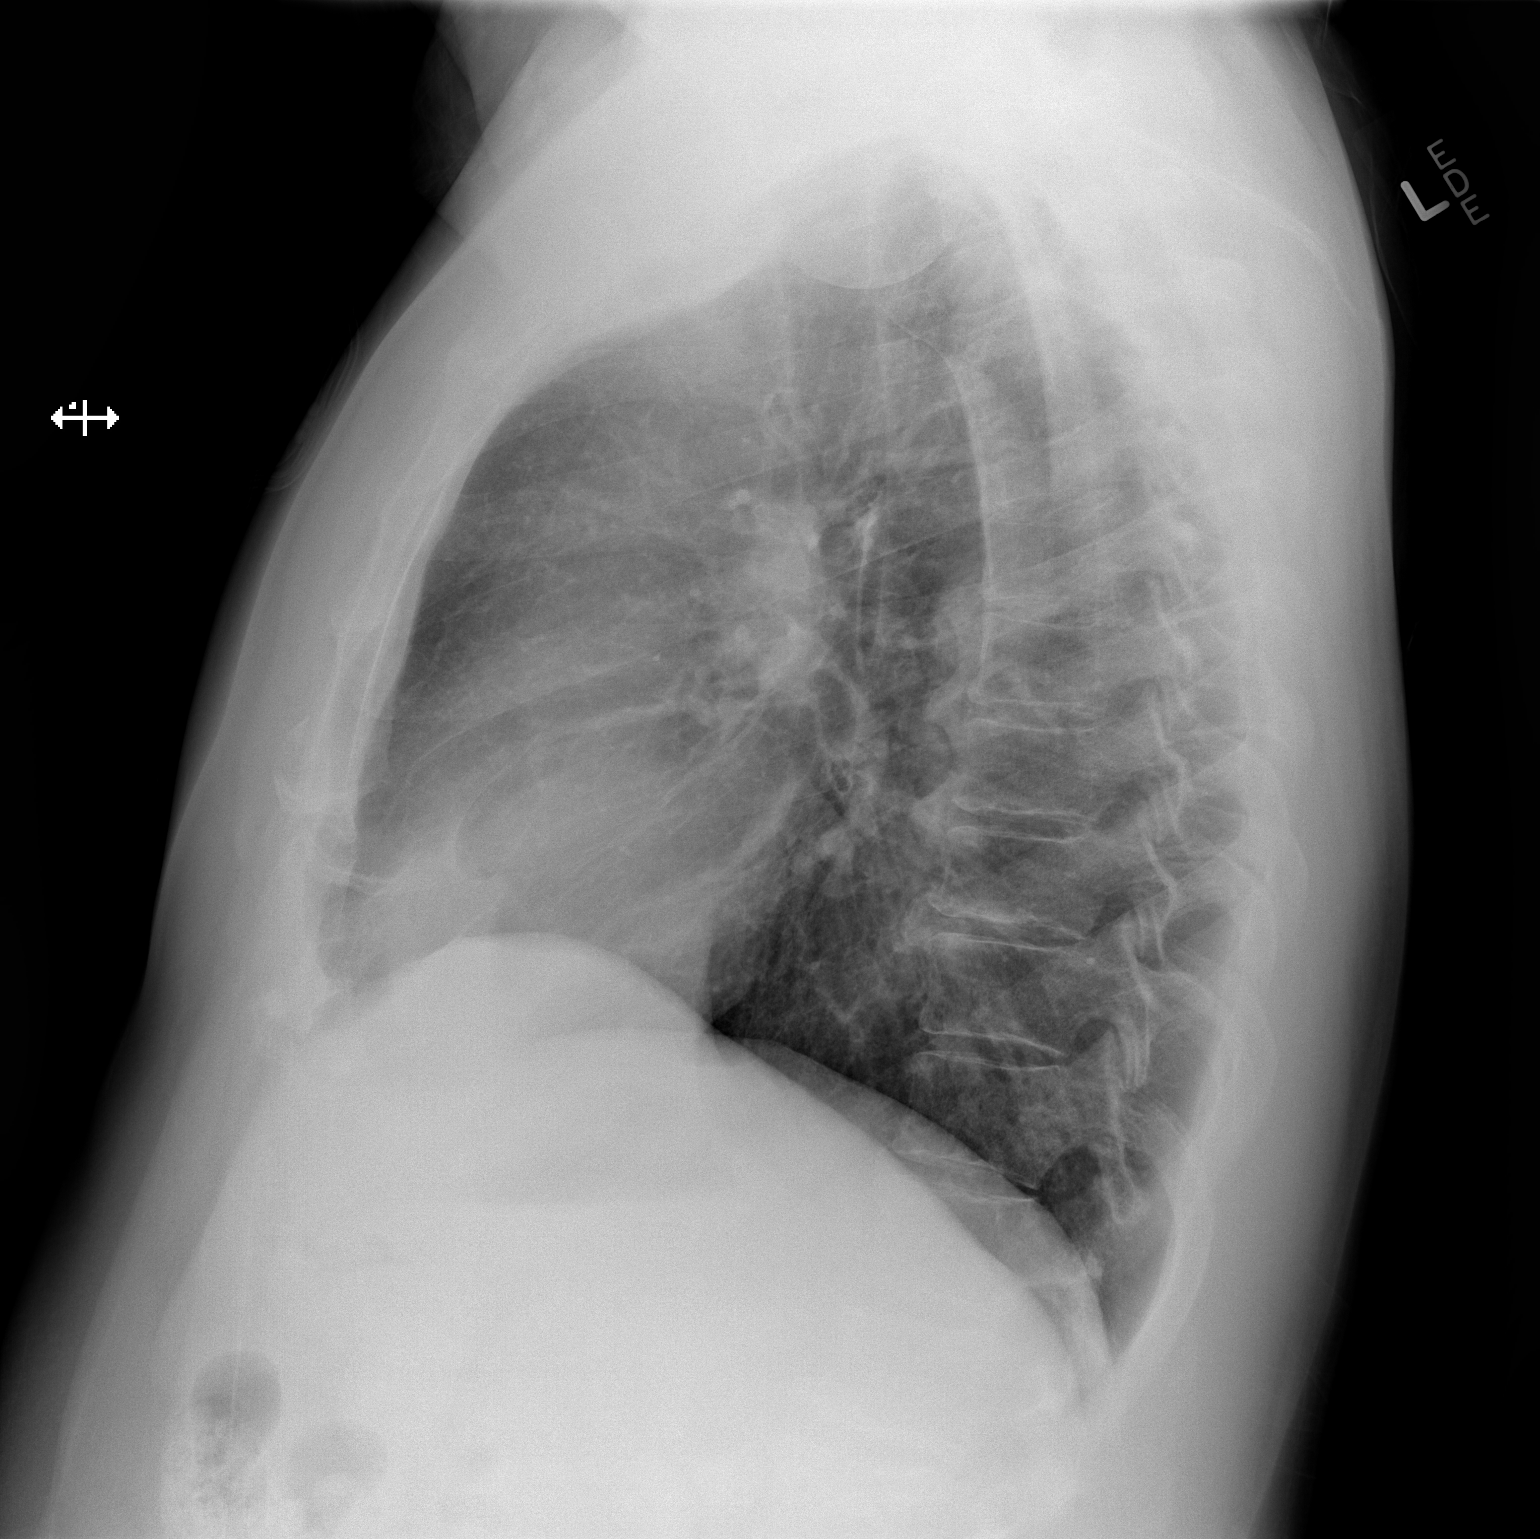

[2 of 2 positions shown; findings below may reference images not displayed]

FINDINGS: Cardiomediastinal silhouette is unremarkable.  No acute
infiltrate or pleural effusion.  No pulmonary edema.  Degenerative
changes are noted mid thoracic spine.
IMPRESSION: No active disease. Degenerative changes mid thoracic spine.

## 2014-06-25 IMAGING — RF DG C-ARM 61-120 MIN
1 series · 1 of 1 positions shown · non-contrast
Comparison: CT cervical spine 04/26/2012.

CLINICAL DATA: Neck pain.  Prior fusion.

DG C-ARM 1-60 MIN,DG CERVICAL SPINE - 1 VIEW
TECHNIQUE: C-arm fluoroscopic images were obtained
intraoperatively and submitted for postoperative interpretation.
Please see the performing provider's procedural report for the
fluoroscopy time utilized.

[Series 1: run · 1 of 1 slices shown]
[im 1/1]
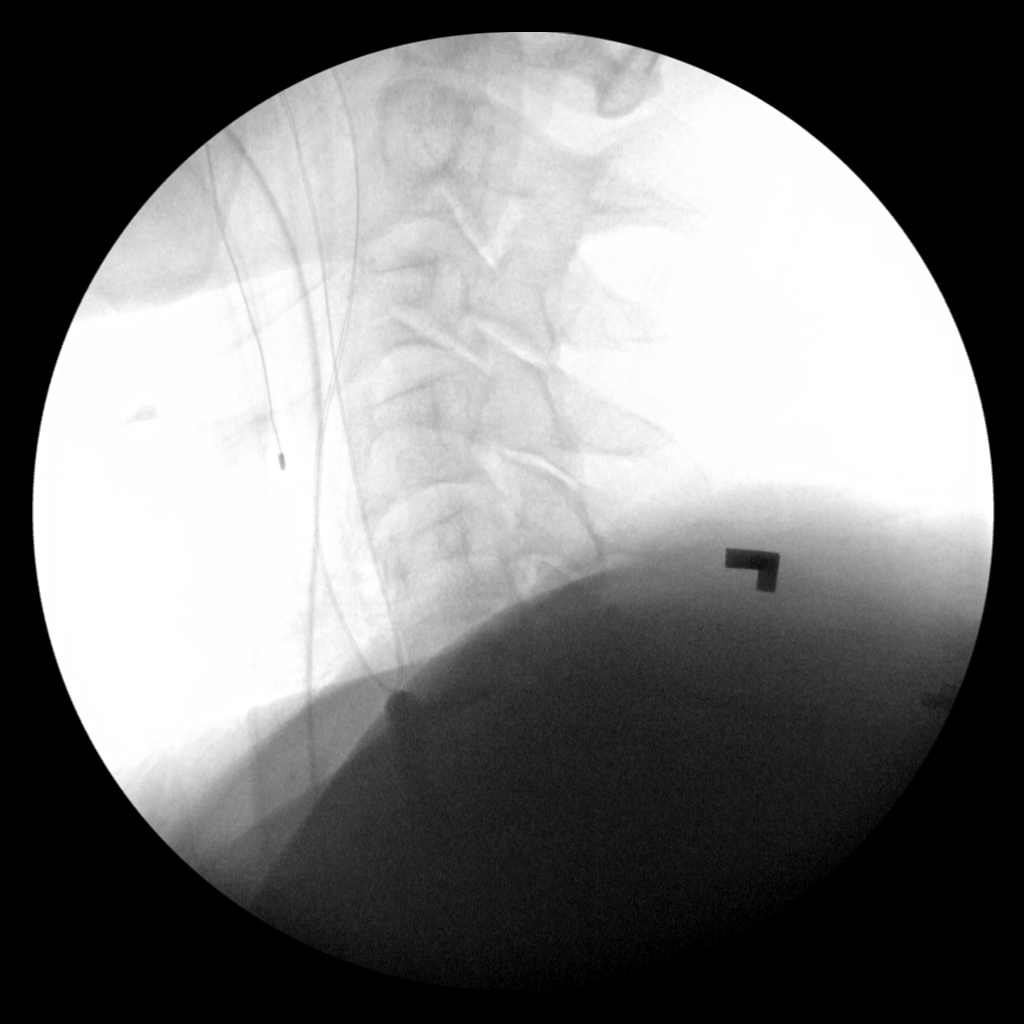

[1 of 1 positions shown; findings below may reference images not displayed]

FINDINGS: Hardware at C5-6 has been removed.  C6-7 ACDF has been
performed.  Within limits of visualization on these C-arm images,
satisfactory position and alignment.
IMPRESSION: As above.

## 2014-09-17 ENCOUNTER — Ambulatory Visit: Admission: EM | Admit: 2014-09-17 | Discharge: 2014-09-17 | Discharge status: Absent without leave | Payer: Self-pay

## 2020-03-28 DIAGNOSIS — Z6834 Body mass index (BMI) 34.0-34.9, adult: Secondary | ICD-10-CM | POA: Diagnosis not present

## 2020-03-28 DIAGNOSIS — K219 Gastro-esophageal reflux disease without esophagitis: Secondary | ICD-10-CM | POA: Diagnosis not present

## 2020-03-28 DIAGNOSIS — I10 Essential (primary) hypertension: Secondary | ICD-10-CM | POA: Diagnosis not present

## 2020-03-28 DIAGNOSIS — E7800 Pure hypercholesterolemia, unspecified: Secondary | ICD-10-CM | POA: Diagnosis not present

## 2020-03-28 DIAGNOSIS — F321 Major depressive disorder, single episode, moderate: Secondary | ICD-10-CM | POA: Diagnosis not present

## 2020-04-17 ENCOUNTER — Encounter: Payer: Self-pay | Admitting: Podiatry

## 2020-04-17 ENCOUNTER — Ambulatory Visit: Payer: BC Managed Care – PPO | Admitting: Podiatry

## 2020-04-17 ENCOUNTER — Ambulatory Visit (INDEPENDENT_AMBULATORY_CARE_PROVIDER_SITE_OTHER): Payer: BC Managed Care – PPO

## 2020-04-17 ENCOUNTER — Other Ambulatory Visit: Payer: Self-pay

## 2020-04-17 DIAGNOSIS — K219 Gastro-esophageal reflux disease without esophagitis: Secondary | ICD-10-CM

## 2020-04-17 DIAGNOSIS — M5090 Cervical disc disorder, unspecified, unspecified cervical region: Secondary | ICD-10-CM | POA: Insufficient documentation

## 2020-04-17 DIAGNOSIS — F4312 Post-traumatic stress disorder, chronic: Secondary | ICD-10-CM

## 2020-04-17 DIAGNOSIS — F17200 Nicotine dependence, unspecified, uncomplicated: Secondary | ICD-10-CM

## 2020-04-17 DIAGNOSIS — G2581 Restless legs syndrome: Secondary | ICD-10-CM

## 2020-04-17 DIAGNOSIS — H9193 Unspecified hearing loss, bilateral: Secondary | ICD-10-CM

## 2020-04-17 DIAGNOSIS — G5600 Carpal tunnel syndrome, unspecified upper limb: Secondary | ICD-10-CM | POA: Insufficient documentation

## 2020-04-17 DIAGNOSIS — L97509 Non-pressure chronic ulcer of other part of unspecified foot with unspecified severity: Secondary | ICD-10-CM | POA: Insufficient documentation

## 2020-04-17 DIAGNOSIS — M86671 Other chronic osteomyelitis, right ankle and foot: Secondary | ICD-10-CM | POA: Diagnosis not present

## 2020-04-17 DIAGNOSIS — F39 Unspecified mood [affective] disorder: Secondary | ICD-10-CM | POA: Insufficient documentation

## 2020-04-17 DIAGNOSIS — E785 Hyperlipidemia, unspecified: Secondary | ICD-10-CM

## 2020-04-17 DIAGNOSIS — L97519 Non-pressure chronic ulcer of other part of right foot with unspecified severity: Secondary | ICD-10-CM | POA: Diagnosis not present

## 2020-04-17 DIAGNOSIS — I10 Essential (primary) hypertension: Secondary | ICD-10-CM | POA: Insufficient documentation

## 2020-04-17 DIAGNOSIS — S069XAA Unspecified intracranial injury with loss of consciousness status unknown, initial encounter: Secondary | ICD-10-CM | POA: Insufficient documentation

## 2020-04-17 DIAGNOSIS — M7989 Other specified soft tissue disorders: Secondary | ICD-10-CM

## 2020-04-17 DIAGNOSIS — F3181 Bipolar II disorder: Secondary | ICD-10-CM

## 2020-04-17 DIAGNOSIS — G629 Polyneuropathy, unspecified: Secondary | ICD-10-CM

## 2020-04-17 DIAGNOSIS — M7670 Peroneal tendinitis, unspecified leg: Secondary | ICD-10-CM | POA: Insufficient documentation

## 2020-04-17 DIAGNOSIS — S069X9A Unspecified intracranial injury with loss of consciousness of unspecified duration, initial encounter: Secondary | ICD-10-CM | POA: Insufficient documentation

## 2020-04-17 DIAGNOSIS — R030 Elevated blood-pressure reading, without diagnosis of hypertension: Secondary | ICD-10-CM

## 2020-04-17 HISTORY — DX: Hyperlipidemia, unspecified: E78.5

## 2020-04-17 HISTORY — DX: Bipolar II disorder: F31.81

## 2020-04-17 HISTORY — DX: Non-pressure chronic ulcer of other part of unspecified foot with unspecified severity: L97.509

## 2020-04-17 HISTORY — DX: Gastro-esophageal reflux disease without esophagitis: K21.9

## 2020-04-17 HISTORY — DX: Carpal tunnel syndrome, unspecified upper limb: G56.00

## 2020-04-17 HISTORY — DX: Peroneal tendinitis, unspecified leg: M76.70

## 2020-04-17 HISTORY — DX: Cervical disc disorder, unspecified, unspecified cervical region: M50.90

## 2020-04-17 HISTORY — DX: Restless legs syndrome: G25.81

## 2020-04-17 HISTORY — DX: Essential (primary) hypertension: I10

## 2020-04-17 HISTORY — DX: Unspecified mood (affective) disorder: F39

## 2020-04-17 HISTORY — DX: Elevated blood-pressure reading, without diagnosis of hypertension: R03.0

## 2020-04-17 HISTORY — DX: Nicotine dependence, unspecified, uncomplicated: F17.200

## 2020-04-17 HISTORY — DX: Unspecified hearing loss, bilateral: H91.93

## 2020-04-17 HISTORY — DX: Post-traumatic stress disorder, chronic: F43.12

## 2020-04-17 HISTORY — DX: Polyneuropathy, unspecified: G62.9

## 2020-04-17 HISTORY — DX: Unspecified intracranial injury with loss of consciousness status unknown, initial encounter: S06.9XAA

## 2020-04-17 HISTORY — DX: Other specified soft tissue disorders: M79.89

## 2020-04-17 NOTE — Progress Notes (Signed)
  Subjective:  Patient ID: Aaron Jimenez, male    DOB: July 06, 1955,  MRN: [id number]  Chief Complaint  Patient presents with  . Foot Ulcer    2nd toe on the right and was going to the [place] and they did nothing and the doctor that I am seeing now gave me thermazene and is not helping   65 y.o. male presents for wound care. Hx confirmed with patient.  Objective:  Physical Exam: Wound Location: Right 2nd toe Wound Measurement: 0.4x0.4 Wound Base: Granular/Healthy Peri-wound: Normal Exudate: None: wound tissue dry wound without warmth, erythema, signs of acute infection No probe to bone.  No images are attached to the encounter.  Radiographs:  X-ray of the right foot: no soft tissue emphysema, no definite signs of osteomyelitis Assessment:   1. Ulcer of toe of right foot, unspecified ulcer stage ([place])   2. Other chronic osteomyelitis of right foot ([place])    Plan:  Patient was evaluated and treated and all questions answered.  Ulcer toe right 2nd toe -Educated on etiology -No acute signs of infection today. No need for ABx. -Will order MRI to evaluate for OM given negative XRs. -Dressed 2nd toe with abx ointment and band-aid. -Should wound not show signs of OM would consider hammertoe correction and wound closure.  No follow-ups on file.

## 2020-04-18 HISTORY — PX: TOE AMPUTATION: SHX809

## 2020-04-21 ENCOUNTER — Telehealth: Payer: Self-pay | Admitting: *Deleted

## 2020-04-21 ENCOUNTER — Encounter: Payer: Self-pay | Admitting: Podiatry

## 2020-04-21 NOTE — Telephone Encounter (Signed)
Called and spoke with Aaron Jimenez at [place] and the procedure code [phone] does not need an authorization and the reference number is [id number]. [name]

## 2020-04-23 DIAGNOSIS — L97519 Non-pressure chronic ulcer of other part of right foot with unspecified severity: Secondary | ICD-10-CM | POA: Diagnosis not present

## 2020-04-23 DIAGNOSIS — M109 Gout, unspecified: Secondary | ICD-10-CM | POA: Diagnosis not present

## 2020-04-23 DIAGNOSIS — M7989 Other specified soft tissue disorders: Secondary | ICD-10-CM | POA: Diagnosis not present

## 2020-04-23 DIAGNOSIS — M19071 Primary osteoarthritis, right ankle and foot: Secondary | ICD-10-CM | POA: Diagnosis not present

## 2020-05-01 ENCOUNTER — Ambulatory Visit: Payer: BC Managed Care – PPO | Admitting: Podiatry

## 2020-05-01 ENCOUNTER — Other Ambulatory Visit: Payer: Self-pay

## 2020-05-01 ENCOUNTER — Encounter: Payer: Self-pay | Admitting: Podiatry

## 2020-05-01 DIAGNOSIS — M86671 Other chronic osteomyelitis, right ankle and foot: Secondary | ICD-10-CM | POA: Diagnosis not present

## 2020-05-01 DIAGNOSIS — M79676 Pain in unspecified toe(s): Secondary | ICD-10-CM

## 2020-05-01 DIAGNOSIS — L97514 Non-pressure chronic ulcer of other part of right foot with necrosis of bone: Secondary | ICD-10-CM

## 2020-05-01 NOTE — Progress Notes (Signed)
  Subjective:  Patient ID: Aaron Jimenez, male    DOB: [birth year],  MRN: [id number]  Chief Complaint  Patient presents with  . Ulcer    F/U Rt 2nd ulcer check -pt states," it looks worse, hurts to walk." -per pt with increase of redness and swelling , w/ drainage -no N/V/F/Ch  - no warmth Tx: Band-Aid and abx ointment   65 y.o. male presents for wound care. Hx confirmed with patient.  Objective:  Physical Exam: Wound Location: Right 2nd toe Wound Measurement: 0.4x0.4 Wound Base: Granular/Healthy Peri-wound: Normal Exudate: None: wound tissue dry wound without warmth, erythema, signs of acute infection No probe to bone.  No images are attached to the encounter.  Radiographs:  X-ray of the right foot: no soft tissue emphysema, no definite signs of osteomyelitis Assessment:   1. 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 Other chronic osteomyelitis of right foot ([place])    Plan:  Patient was evaluated and treated and all questions answered.  Ulcer toe right 2nd toe -MRI reviewed - osteomyelitis of all 3 bones of the 2nd toe. -We discussed possible treatments including amputation and antibiotics. We discussed r/b of each. Patient agrees to proceed with amputation. -Patient has failed all conservative therapy and wishes to proceed with surgical intervention. All risks, benefits, and alternatives discussed with patient. No guarantees given. Consent reviewed and signed by patient. -Planned procedures: right second toe amputation      No follow-ups on file.

## 2020-05-05 ENCOUNTER — Telehealth: Payer: Self-pay | Admitting: Urology

## 2020-05-05 NOTE — Telephone Encounter (Signed)
DOS - 05/07/20  AMPUTATION TOE MPJ JOINT 2ND RIGHT --- [id number]   [employer] EFFECTIVE DATE - 03-26-20   PLAN DEDUCTIBLE - $2,000.00 W/ $7,460.02 REMAINING OUT OF POCKET - $8,000.00 W/ $7,268.20  REMAINING COINSURANCE - 30% COPAY -  $0.00  NO PRIOR AUTH REQUIRED

## 2020-05-07 ENCOUNTER — Other Ambulatory Visit: Payer: Self-pay | Admitting: Podiatry

## 2020-05-07 DIAGNOSIS — I96 Gangrene, not elsewhere classified: Secondary | ICD-10-CM | POA: Diagnosis not present

## 2020-05-07 DIAGNOSIS — M86671 Other chronic osteomyelitis, right ankle and foot: Secondary | ICD-10-CM | POA: Diagnosis not present

## 2020-05-07 MED ORDER — ONDANSETRON HCL 4 MG PO TABS: 4.0000 mg | ORAL_TABLET | Freq: Three times a day (TID) | ORAL | 0 refills | Status: DC | PRN

## 2020-05-07 MED ORDER — CEPHALEXIN 500 MG PO CAPS: 500.0000 mg | ORAL_CAPSULE | Freq: Two times a day (BID) | ORAL | 0 refills | Status: DC

## 2020-05-07 MED ORDER — OXYCODONE-ACETAMINOPHEN 5-325 MG PO TABS: 1.0000 | ORAL_TABLET | ORAL | 0 refills | Status: DC | PRN

## 2020-05-07 NOTE — Progress Notes (Signed)
Rx sent to pharmacy for outpatient surgery. °

## 2020-05-12 ENCOUNTER — Ambulatory Visit (INDEPENDENT_AMBULATORY_CARE_PROVIDER_SITE_OTHER): Payer: BC Managed Care – PPO

## 2020-05-12 ENCOUNTER — Ambulatory Visit (INDEPENDENT_AMBULATORY_CARE_PROVIDER_SITE_OTHER): Payer: BC Managed Care – PPO | Admitting: Podiatry

## 2020-05-12 ENCOUNTER — Other Ambulatory Visit: Payer: Self-pay | Admitting: Podiatry

## 2020-05-12 ENCOUNTER — Other Ambulatory Visit: Payer: Self-pay

## 2020-05-12 DIAGNOSIS — M86671 Other chronic osteomyelitis, right ankle and foot: Secondary | ICD-10-CM

## 2020-05-12 DIAGNOSIS — L97519 Non-pressure chronic ulcer of other part of right foot with unspecified severity: Secondary | ICD-10-CM

## 2020-05-12 DIAGNOSIS — Z9889 Other specified postprocedural states: Secondary | ICD-10-CM

## 2020-05-12 NOTE — Progress Notes (Signed)
  Subjective:  Patient ID: Aaron Jimenez, male    DOB: July 11, 1955,  MRN: [id number]  DOS: 05/07/20 Procedure: Amputation right 2nd toe  65 y.o. male presents with the above complaint. History confirmed with patient. Doing well does have some phantom toe pain.  Objective:  Physical Exam: tenderness at the surgical site, local edema noted and calf supple, nontender. Incision: healing well, no significant drainage, no dehiscence, no significant erythema  No images are attached to the encounter.  Radiographs: X-ray of the right foot: consistent with post-op state interval amputation 2nd toe without complication   Assessment:   1. Ulcer of toe of right foot, unspecified ulcer stage ([place])   2. Other chronic osteomyelitis of right foot ([place])     Plan:  Patient was evaluated and treated and all questions answered.  Post-operative State -XR reviewed with patient -Dressing applied consisting of povidone, sterile gauze, kerlix and ACE bandage -WBAT in Surgical shoe  No follow-ups on file.

## 2020-05-15 ENCOUNTER — Ambulatory Visit: Payer: BC Managed Care – PPO | Admitting: Podiatry

## 2020-05-19 ENCOUNTER — Encounter: Payer: Self-pay | Admitting: Podiatry

## 2020-05-19 ENCOUNTER — Other Ambulatory Visit: Payer: Self-pay

## 2020-05-19 ENCOUNTER — Ambulatory Visit (INDEPENDENT_AMBULATORY_CARE_PROVIDER_SITE_OTHER): Payer: BC Managed Care – PPO | Admitting: Podiatry

## 2020-05-19 DIAGNOSIS — L97519 Non-pressure chronic ulcer of other part of right foot with unspecified severity: Secondary | ICD-10-CM | POA: Diagnosis not present

## 2020-05-19 NOTE — Progress Notes (Signed)
  Subjective:  Patient ID: Aaron Jimenez, male    DOB: Jun 06, 1955,  MRN: [id number]  DOS: 05/07/20 Procedure: Amputation right 2nd toe  Chief Complaint  Patient presents with  . Routine Post Op    POV  -pt denies N/V/F/Ch -pt states still with pain at lateral side of foto but doing much better    65 y.o. male presents with the above complaint. History confirmed with patient. Still has some phantom toe pain, also pain on the outside of the foot.  Objective:  Physical Exam: tenderness at the surgical site, local edema noted and calf supple, nontender. Incision: healing well, no significant drainage, no dehiscence, no significant erythema  Assessment:   1. Ulcer of toe of right foot, unspecified ulcer stage ([place])     Plan:  Patient was evaluated and treated and all questions answered.  Post-operative State -Sutures removed -Ok to start showering at this time. Advised they cannot soak. -Leave staples in 1 additional week  -Ok to transition to normal sneaker.  Return in about 1 week (around 05/26/2020) for Post-Op (No XRs).

## 2020-05-26 ENCOUNTER — Other Ambulatory Visit: Payer: Self-pay

## 2020-05-26 ENCOUNTER — Ambulatory Visit (INDEPENDENT_AMBULATORY_CARE_PROVIDER_SITE_OTHER): Payer: BC Managed Care – PPO | Admitting: Podiatry

## 2020-05-26 DIAGNOSIS — M86671 Other chronic osteomyelitis, right ankle and foot: Secondary | ICD-10-CM

## 2020-05-26 NOTE — Progress Notes (Signed)
  Subjective:  Patient ID: Aaron Jimenez, male    DOB: October 23, 1955,  MRN: [id number]  DOS: 05/07/20 Procedure: Amputation right 2nd toe  Chief Complaint  Patient presents with  . Routine Post Op    POV -pt denies N/V/F/Ch -pt states," w/ numbness at ball of foot and burning at incision site." - can't stand wearing tennis shes - no redness/swelling     65 y.o. male presents with the above complaint. History confirmed with patient. Still has some phantom toe pain, also pain on the outside of the foot.  Objective:  Physical Exam: no tenderness at the surgical site, no edema noted and calf supple, nontender. POP metatarsal heads plantarly. Incision: healed   Assessment:   1. Ulcer of toe of right foot, unspecified ulcer stage ([place])    Plan:  Patient was evaluated and treated and all questions answered.  Post-operative State -Staples removed -Metatarsal pad applied to offload the forefoot in sneakers  -Continue normal shoegear.  Return in about 4 weeks (around 06/23/2020).

## 2020-05-27 ENCOUNTER — Encounter: Payer: Self-pay | Admitting: Podiatry

## 2020-06-06 ENCOUNTER — Telehealth: Payer: Self-pay | Admitting: Podiatry

## 2020-06-06 MED ORDER — CEPHALEXIN 500 MG PO CAPS: 500.0000 mg | ORAL_CAPSULE | Freq: Two times a day (BID) | ORAL | 0 refills | Status: DC

## 2020-06-06 NOTE — Addendum Note (Signed)
Addended by: Hardie Pulley on: 06/06/2020 12:32 PM   Modules accepted: Orders

## 2020-06-06 NOTE — Telephone Encounter (Signed)
When pt puts on boots he is having swelling, redness, and base of toe is cracking up and bleeding.  No pus.  Has f/u on 06-23-20.  Does pt need antibiotic? [place]

## 2020-06-12 ENCOUNTER — Ambulatory Visit (INDEPENDENT_AMBULATORY_CARE_PROVIDER_SITE_OTHER): Payer: BC Managed Care – PPO | Admitting: Podiatry

## 2020-06-12 ENCOUNTER — Encounter: Payer: Self-pay | Admitting: Podiatry

## 2020-06-12 ENCOUNTER — Other Ambulatory Visit: Payer: Self-pay

## 2020-06-12 DIAGNOSIS — L97519 Non-pressure chronic ulcer of other part of right foot with unspecified severity: Secondary | ICD-10-CM | POA: Diagnosis not present

## 2020-06-12 DIAGNOSIS — M86671 Other chronic osteomyelitis, right ankle and foot: Secondary | ICD-10-CM | POA: Diagnosis not present

## 2020-06-12 NOTE — Progress Notes (Signed)
  Subjective:  Patient ID: Aaron Jimenez, male    DOB: 1955/06/05,  MRN: [id number]  DOS: 05/07/20 Procedure: Amputation right 2nd toe  Chief Complaint  Patient presents with  . Routine Post Op    I wore some boots and the incision opened up and was using peroxide and came by and the doctor put me on antibiotics and will heal up and it will bust open again on the 2nd toe right    65 y.o. male presents with the above complaint. History confirmed with patient. Phantom pain improving.  Objective:  Physical Exam: no tenderness at the surgical site, no edema noted and calf supple, nontender. POP metatarsal heads plantarly. Incision: small retained suture abscess.  Assessment:   1. Other chronic osteomyelitis of right foot ([place])   2. Ulcer of toe of right foot, unspecified ulcer stage ([place])    Plan:  Patient was evaluated and treated and all questions answered.  Post-operative State -Healing well, likely small retained stitch abscess. No signs of infection noted today -Continue to transition back to normal shegear including boots. -Should phantom pain persist consider starting neurontin.  Return in about 1 month (around 07/13/2020).

## 2020-06-23 ENCOUNTER — Ambulatory Visit: Payer: BC Managed Care – PPO | Admitting: Podiatry

## 2020-06-23 DIAGNOSIS — K219 Gastro-esophageal reflux disease without esophagitis: Secondary | ICD-10-CM | POA: Diagnosis not present

## 2020-06-23 DIAGNOSIS — F321 Major depressive disorder, single episode, moderate: Secondary | ICD-10-CM | POA: Diagnosis not present

## 2020-06-23 DIAGNOSIS — E7800 Pure hypercholesterolemia, unspecified: Secondary | ICD-10-CM | POA: Diagnosis not present

## 2020-06-23 DIAGNOSIS — I10 Essential (primary) hypertension: Secondary | ICD-10-CM | POA: Diagnosis not present

## 2020-06-23 DIAGNOSIS — Z6828 Body mass index (BMI) 28.0-28.9, adult: Secondary | ICD-10-CM | POA: Diagnosis not present

## 2020-07-14 ENCOUNTER — Ambulatory Visit (INDEPENDENT_AMBULATORY_CARE_PROVIDER_SITE_OTHER): Payer: BC Managed Care – PPO | Admitting: Podiatry

## 2020-07-14 ENCOUNTER — Encounter: Payer: Self-pay | Admitting: Podiatry

## 2020-07-14 ENCOUNTER — Other Ambulatory Visit: Payer: Self-pay

## 2020-07-14 DIAGNOSIS — L97519 Non-pressure chronic ulcer of other part of right foot with unspecified severity: Secondary | ICD-10-CM | POA: Diagnosis not present

## 2020-07-14 DIAGNOSIS — M86671 Other chronic osteomyelitis, right ankle and foot: Secondary | ICD-10-CM | POA: Diagnosis not present

## 2020-07-14 NOTE — Progress Notes (Signed)
  Subjective:  Patient ID: Aaron Jimenez, male    DOB: November 02, 1955,  MRN: [id number]  DOS: 05/07/20 Procedure: Amputation right 2nd toe  Chief Complaint  Patient presents with   Routine Post Op    I am doing great and the 2nd toe has a spot and it rubs the shoe on the right foot     65 y.o. male presents with the above complaint. History confirmed with patient. No longer having phantom pain. Back into his normal shoegear comfortably.  Objective:  Physical Exam: no tenderness at the surgical site, no edema noted and calf supple, nontender Incision: scant dorsal HPK without open ulceration.  Assessment:   1. Other chronic osteomyelitis of right foot ([place])   2. Ulcer of toe of right foot, unspecified ulcer stage ([place])     Plan:  Patient was evaluated and treated and all questions answered.  Post-operative State -Fully healed. Patient should do very well. He did have a small bit of hardened skin, possible 2/2 deep suture but this was debrided sharply today. -F/u PRN  Return if symptoms worsen or fail to improve.

## 2020-10-14 DIAGNOSIS — K219 Gastro-esophageal reflux disease without esophagitis: Secondary | ICD-10-CM | POA: Diagnosis not present

## 2020-10-14 DIAGNOSIS — I10 Essential (primary) hypertension: Secondary | ICD-10-CM | POA: Diagnosis not present

## 2020-10-14 DIAGNOSIS — Z23 Encounter for immunization: Secondary | ICD-10-CM | POA: Diagnosis not present

## 2020-10-14 DIAGNOSIS — F321 Major depressive disorder, single episode, moderate: Secondary | ICD-10-CM | POA: Diagnosis not present

## 2020-10-14 DIAGNOSIS — E7800 Pure hypercholesterolemia, unspecified: Secondary | ICD-10-CM | POA: Diagnosis not present

## 2020-11-24 DIAGNOSIS — F321 Major depressive disorder, single episode, moderate: Secondary | ICD-10-CM | POA: Diagnosis not present

## 2020-11-24 DIAGNOSIS — M170 Bilateral primary osteoarthritis of knee: Secondary | ICD-10-CM | POA: Diagnosis not present

## 2020-11-24 DIAGNOSIS — E663 Overweight: Secondary | ICD-10-CM | POA: Diagnosis not present

## 2020-11-24 DIAGNOSIS — Z6827 Body mass index (BMI) 27.0-27.9, adult: Secondary | ICD-10-CM | POA: Diagnosis not present

## 2020-11-28 DIAGNOSIS — S90822A Blister (nonthermal), left foot, initial encounter: Secondary | ICD-10-CM | POA: Diagnosis not present

## 2020-11-28 DIAGNOSIS — Z789 Other specified health status: Secondary | ICD-10-CM | POA: Diagnosis not present

## 2020-11-28 DIAGNOSIS — X58XXXA Exposure to other specified factors, initial encounter: Secondary | ICD-10-CM | POA: Diagnosis not present

## 2020-11-28 DIAGNOSIS — M795 Residual foreign body in soft tissue: Secondary | ICD-10-CM | POA: Diagnosis not present

## 2020-11-28 DIAGNOSIS — S91105A Unspecified open wound of left lesser toe(s) without damage to nail, initial encounter: Secondary | ICD-10-CM | POA: Diagnosis not present

## 2020-11-28 DIAGNOSIS — L03116 Cellulitis of left lower limb: Secondary | ICD-10-CM | POA: Diagnosis not present

## 2020-11-29 DIAGNOSIS — L03116 Cellulitis of left lower limb: Secondary | ICD-10-CM | POA: Diagnosis not present

## 2020-11-29 DIAGNOSIS — M795 Residual foreign body in soft tissue: Secondary | ICD-10-CM | POA: Diagnosis not present

## 2020-11-29 DIAGNOSIS — S91302A Unspecified open wound, left foot, initial encounter: Secondary | ICD-10-CM | POA: Diagnosis not present

## 2020-11-29 DIAGNOSIS — M7989 Other specified soft tissue disorders: Secondary | ICD-10-CM | POA: Diagnosis not present

## 2020-11-30 DIAGNOSIS — E7800 Pure hypercholesterolemia, unspecified: Secondary | ICD-10-CM | POA: Diagnosis not present

## 2020-11-30 DIAGNOSIS — F418 Other specified anxiety disorders: Secondary | ICD-10-CM | POA: Diagnosis not present

## 2020-11-30 DIAGNOSIS — Z89421 Acquired absence of other right toe(s): Secondary | ICD-10-CM | POA: Diagnosis not present

## 2020-11-30 DIAGNOSIS — L03116 Cellulitis of left lower limb: Secondary | ICD-10-CM | POA: Diagnosis not present

## 2020-11-30 DIAGNOSIS — S91302A Unspecified open wound, left foot, initial encounter: Secondary | ICD-10-CM | POA: Diagnosis not present

## 2020-11-30 DIAGNOSIS — Z91030 Bee allergy status: Secondary | ICD-10-CM | POA: Diagnosis not present

## 2020-11-30 DIAGNOSIS — Z8547 Personal history of malignant neoplasm of testis: Secondary | ICD-10-CM | POA: Diagnosis not present

## 2020-11-30 DIAGNOSIS — E785 Hyperlipidemia, unspecified: Secondary | ICD-10-CM | POA: Diagnosis not present

## 2020-11-30 DIAGNOSIS — M7989 Other specified soft tissue disorders: Secondary | ICD-10-CM | POA: Diagnosis not present

## 2020-11-30 DIAGNOSIS — F4310 Post-traumatic stress disorder, unspecified: Secondary | ICD-10-CM | POA: Diagnosis not present

## 2020-11-30 DIAGNOSIS — G2581 Restless legs syndrome: Secondary | ICD-10-CM | POA: Diagnosis not present

## 2020-11-30 DIAGNOSIS — M1990 Unspecified osteoarthritis, unspecified site: Secondary | ICD-10-CM | POA: Diagnosis not present

## 2020-11-30 DIAGNOSIS — G629 Polyneuropathy, unspecified: Secondary | ICD-10-CM | POA: Diagnosis not present

## 2020-11-30 DIAGNOSIS — B9561 Methicillin susceptible Staphylococcus aureus infection as the cause of diseases classified elsewhere: Secondary | ICD-10-CM | POA: Diagnosis not present

## 2020-11-30 DIAGNOSIS — B9562 Methicillin resistant Staphylococcus aureus infection as the cause of diseases classified elsewhere: Secondary | ICD-10-CM | POA: Diagnosis not present

## 2020-11-30 DIAGNOSIS — M795 Residual foreign body in soft tissue: Secondary | ICD-10-CM | POA: Diagnosis not present

## 2020-11-30 DIAGNOSIS — K219 Gastro-esophageal reflux disease without esophagitis: Secondary | ICD-10-CM | POA: Diagnosis not present

## 2020-11-30 DIAGNOSIS — F319 Bipolar disorder, unspecified: Secondary | ICD-10-CM | POA: Diagnosis not present

## 2020-11-30 DIAGNOSIS — I10 Essential (primary) hypertension: Secondary | ICD-10-CM | POA: Diagnosis not present

## 2020-12-09 DIAGNOSIS — L0390 Cellulitis, unspecified: Secondary | ICD-10-CM | POA: Diagnosis not present

## 2020-12-10 DIAGNOSIS — M1712 Unilateral primary osteoarthritis, left knee: Secondary | ICD-10-CM | POA: Diagnosis not present

## 2020-12-10 DIAGNOSIS — G8929 Other chronic pain: Secondary | ICD-10-CM | POA: Diagnosis not present

## 2020-12-10 DIAGNOSIS — M25562 Pain in left knee: Secondary | ICD-10-CM | POA: Diagnosis not present

## 2020-12-17 DIAGNOSIS — M25561 Pain in right knee: Secondary | ICD-10-CM | POA: Diagnosis not present

## 2020-12-17 DIAGNOSIS — M1711 Unilateral primary osteoarthritis, right knee: Secondary | ICD-10-CM | POA: Diagnosis not present

## 2020-12-17 DIAGNOSIS — G8929 Other chronic pain: Secondary | ICD-10-CM | POA: Diagnosis not present

## 2020-12-18 ENCOUNTER — Encounter: Payer: Self-pay | Admitting: Podiatry

## 2020-12-18 ENCOUNTER — Ambulatory Visit: Payer: BC Managed Care – PPO | Admitting: Podiatry

## 2020-12-18 DIAGNOSIS — L97521 Non-pressure chronic ulcer of other part of left foot limited to breakdown of skin: Secondary | ICD-10-CM | POA: Diagnosis not present

## 2020-12-18 DIAGNOSIS — L988 Other specified disorders of the skin and subcutaneous tissue: Secondary | ICD-10-CM

## 2020-12-18 NOTE — Progress Notes (Signed)
  Subjective:  Patient ID: Aaron Jimenez, male    DOB: [birth year],  MRN: [id number]  Chief Complaint  Patient presents with   Foot Ulcer    The left 4th and 5th toes were blood blisters and went to the ER on 12/01/2020 and stayed and got IV antibiotics and hurts with shoes and did have a smell   65 y.o. male presents with the above complaint. History confirmed with patient. At the ED he had MRI performed which did not show evidence of OM. He has been applying silvadene rxed by his PCP which is helping and the wound is doing better.  Objective:  Physical Exam: warm, good capillary refill, no trophic changes or ulcerative lesions, normal DP and PT pulses, and normal sensory exam. Left Foot: left fourth interspaced healed ulceration with residual redundant skin and mild HPK. Interdigital maceration noted.  Right Foot: 2nd toe amputation well healed.   Assessment:   1. Ulcerated, foot, left, limited to breakdown of skin ([place])   2. Maceration of skin    Plan:  Patient was evaluated and treated and all questions answered.  Ulcer left foot, now healed -Area cleansed and minimal non-proccedural debridement was performed. Dressed with betadine given interdigial maceration. Stop silvadene. -Will allow a little more time for him to get used to wearing normal shoegear. WBAT in normal shoes. Plan for RTW 12/29/20. -F/u in 1 month to ensure healing.  No follow-ups on file.

## 2021-01-28 ENCOUNTER — Other Ambulatory Visit: Payer: Self-pay

## 2021-01-28 ENCOUNTER — Encounter: Payer: Self-pay | Admitting: Podiatry

## 2021-01-28 ENCOUNTER — Ambulatory Visit: Payer: BC Managed Care – PPO | Admitting: Podiatry

## 2021-01-28 VITALS — Temp 98.1°F

## 2021-01-28 DIAGNOSIS — L988 Other specified disorders of the skin and subcutaneous tissue: Secondary | ICD-10-CM | POA: Diagnosis not present

## 2021-01-28 DIAGNOSIS — L97521 Non-pressure chronic ulcer of other part of left foot limited to breakdown of skin: Secondary | ICD-10-CM

## 2021-01-29 ENCOUNTER — Ambulatory Visit: Payer: BC Managed Care – PPO | Admitting: Podiatry

## 2021-02-04 NOTE — Progress Notes (Signed)
°  Subjective:  Patient ID: Aaron Jimenez, male    DOB: Jun 07, 1955,  MRN: [id number]  Chief Complaint  Patient presents with   Routine Post Op    I am doing pretty good and I have changed up my foot wear on the left foot    66 y.o. male presents with the above complaint. History confirmed with patient. Doing very well denies new issues or concerns.  Objective:  Physical Exam: warm, good capillary refill, no trophic changes or ulcerative lesions, normal DP and PT pulses, and normal sensory exam. Left Foot: left fourth interspaced healed ulceration   Right Foot: 2nd toe amputation well healed.   Assessment:   1. Ulcerated, foot, left, limited to breakdown of skin ([place])   2. Maceration of skin     Plan:  Patient was evaluated and treated and all questions answered.  Ulcer left foot, now healed -All wounds healed. No new issues. Will d/c with f/u should issues recur. -Patient is back at work without issues with his shoegear. Advised to inform should these occur.   No follow-ups on file.

## 2021-04-01 DIAGNOSIS — R55 Syncope and collapse: Secondary | ICD-10-CM | POA: Diagnosis not present

## 2021-04-01 DIAGNOSIS — M25511 Pain in right shoulder: Secondary | ICD-10-CM | POA: Diagnosis not present

## 2021-04-02 DIAGNOSIS — R55 Syncope and collapse: Secondary | ICD-10-CM | POA: Diagnosis not present

## 2021-04-02 DIAGNOSIS — E7800 Pure hypercholesterolemia, unspecified: Secondary | ICD-10-CM | POA: Diagnosis not present

## 2021-04-02 DIAGNOSIS — I10 Essential (primary) hypertension: Secondary | ICD-10-CM | POA: Diagnosis not present

## 2021-04-02 DIAGNOSIS — M25511 Pain in right shoulder: Secondary | ICD-10-CM | POA: Diagnosis not present

## 2021-04-09 DIAGNOSIS — M25511 Pain in right shoulder: Secondary | ICD-10-CM | POA: Diagnosis not present

## 2021-04-09 DIAGNOSIS — M24111 Other articular cartilage disorders, right shoulder: Secondary | ICD-10-CM | POA: Diagnosis not present

## 2021-04-16 DIAGNOSIS — M25511 Pain in right shoulder: Secondary | ICD-10-CM | POA: Diagnosis not present

## 2021-04-16 DIAGNOSIS — G8929 Other chronic pain: Secondary | ICD-10-CM | POA: Diagnosis not present

## 2021-04-29 DIAGNOSIS — M25511 Pain in right shoulder: Secondary | ICD-10-CM | POA: Diagnosis not present

## 2021-05-12 ENCOUNTER — Ambulatory Visit: Payer: Self-pay | Admitting: Cardiology

## 2021-06-11 DIAGNOSIS — R42 Dizziness and giddiness: Secondary | ICD-10-CM | POA: Diagnosis not present

## 2021-06-11 DIAGNOSIS — I10 Essential (primary) hypertension: Secondary | ICD-10-CM | POA: Diagnosis not present

## 2021-06-11 DIAGNOSIS — R55 Syncope and collapse: Secondary | ICD-10-CM | POA: Diagnosis not present

## 2021-06-11 DIAGNOSIS — R918 Other nonspecific abnormal finding of lung field: Secondary | ICD-10-CM | POA: Diagnosis not present

## 2021-06-11 DIAGNOSIS — T753XXA Motion sickness, initial encounter: Secondary | ICD-10-CM | POA: Diagnosis not present

## 2021-06-11 DIAGNOSIS — R110 Nausea: Secondary | ICD-10-CM | POA: Diagnosis not present

## 2021-06-16 ENCOUNTER — Ambulatory Visit: Payer: BC Managed Care – PPO | Admitting: Cardiology

## 2021-06-16 ENCOUNTER — Encounter: Payer: Self-pay | Admitting: Cardiology

## 2021-06-16 ENCOUNTER — Ambulatory Visit (INDEPENDENT_AMBULATORY_CARE_PROVIDER_SITE_OTHER): Payer: BC Managed Care – PPO

## 2021-06-16 VITALS — BP 100/60 | HR 69 | Ht 75.0 in | Wt 205.0 lb

## 2021-06-16 DIAGNOSIS — I10 Essential (primary) hypertension: Secondary | ICD-10-CM

## 2021-06-16 DIAGNOSIS — R55 Syncope and collapse: Secondary | ICD-10-CM | POA: Diagnosis not present

## 2021-06-16 DIAGNOSIS — R0609 Other forms of dyspnea: Secondary | ICD-10-CM

## 2021-06-16 DIAGNOSIS — E785 Hyperlipidemia, unspecified: Secondary | ICD-10-CM | POA: Diagnosis not present

## 2021-06-16 DIAGNOSIS — F3181 Bipolar II disorder: Secondary | ICD-10-CM

## 2021-06-16 DIAGNOSIS — F17200 Nicotine dependence, unspecified, uncomplicated: Secondary | ICD-10-CM

## 2021-06-16 HISTORY — DX: Syncope and collapse: R55

## 2021-06-16 NOTE — Patient Instructions (Addendum)
Medication Instructions:  Your physician recommends that you continue on your current medications as directed. Please refer to the Current Medication list given to you today.  *If you need a refill on your cardiac medications before your next appointment, please call your pharmacy*   Lab Work: None Ordered If you have labs (blood work) drawn today and your tests are completely normal, you will receive your results only by: [place] (if you have MyChart) OR A paper copy in the mail If you have any lab test that is abnormal or we need to change your treatment, we will call you to review the results.   Testing/Procedures: Your physician has requested that you have an echocardiogram. Echocardiography is a painless test that uses sound waves to create images of your heart. It provides your doctor with information about the size and shape of your heart and how well your heart's chambers and valves are working. This procedure takes approximately one hour. There are no restrictions for this procedure.    WHY IS MY DOCTOR PRESCRIBING ZIO? The Zio system is proven and trusted by physicians to detect and diagnose irregular heart rhythms -- and has been prescribed to hundreds of thousands of patients.  The FDA has cleared the Zio system to monitor for many different kinds of irregular heart rhythms. In a study, physicians were able to reach a diagnosis 90% of the time with the Zio system1.  You can wear the Zio monitor -- a small, discreet, comfortable patch -- during your normal day-to-day activity, including while you sleep, shower, and exercise, while it records every single heartbeat for analysis.  1Barrett, P., et al. Comparison of 24 Hour Holter Monitoring Versus 14 Day Novel Adhesive Patch Electrocardiographic Monitoring. [place], 2014.  ZIO VS. HOLTER MONITORING The Zio monitor can be comfortably worn for up to 14 days. Holter monitors can be worn for 24 to 48  hours, limiting the time to record any irregular heart rhythms you may have. Zio is able to capture data for the 51% of patients who have their first symptom-triggered arrhythmia after 48 hours.1  LIVE WITHOUT RESTRICTIONS The Zio ambulatory cardiac monitor is a small, unobtrusive, and water-resistant patch--you might even forget you're wearing it. The Zio monitor records and stores every beat of your heart, whether you're sleeping, working out, or showering.     Follow-Up: At [facility], you and your health needs are our priority.  As part of our continuing mission to provide you with exceptional heart care, we have created designated Provider Care Teams.  These Care Teams include your primary Cardiologist (physician) and Advanced Practice Providers (APPs -  Physician Assistants and Nurse Practitioners) who all work together to provide you with the care you need, when you need it.  We recommend signing up for the patient portal called "MyChart".  Sign up information is provided on this After Visit Summary.  MyChart is used to connect with patients for Virtual Visits (Telemedicine).  Patients are able to view lab/test results, encounter notes, upcoming appointments, etc.  Non-urgent messages can be sent to your provider as well.   To learn more about what you can do with MyChart, go to [email or web address].    Your next appointment:   2 month(s)  The format for your next appointment:   In Person  Provider:   Jenne Campus, MD    Other Instructions Referral made to Neurology - They will call for appt

## 2021-06-16 NOTE — Progress Notes (Unsigned)
Cardiology Consultation:    Date:  06/16/2021   ID:  [name], DOB 1955-06-27, MRN [id number]  PCP:  Esperanza Richters, NP  Cardiologist:  Jenne Campus, MD   Referring MD: Esperanza Richters, NP   Chief Complaint  Patient presents with   Loss of Consciousness   Hypertension    History of Present Illness:    Aaron Jimenez is a 66 y.o. male who is being seen today for the evaluation of syncope at the request of Wilburn, Nita Sells, NP.  Past medical history significant for essential hypertension, dyslipidemia, he is an ex-smoker quit smoking few years ago.  He never had any heart trouble.  In December he had first episode of syncope he woke up in the middle of the night, he wanted to go to the restroom started going there and next that he remember is the fact that his wife telling him the next day that he had episode of syncope.  Since that time he describes 10-12 episode of syncope last episode happened in March this time apparently he does not remember anything what happened his wife described a situation when he yell she came to see him he was standing he said I am not feeling well and next thing he did he fell face down.  He never wet himself, he never bite his tongue, she never observed any seizure activity but he tells me that he does not know usually was going on for couple hours after episode.  He sustained already injury to the shoulder because of those episodes.  He does have some traumatic brain injury in the past he also got bipolar disorder treated with medications.  He quit smoking many years ago, he does not exercise on the regular basis, he is a Building control surveyor.  He still drives. Does not have family history of premature coronary artery disease There is a family history of premature cardiac death  Past Medical History:  Diagnosis Date   Anxiety    Cancer ([place])    tumor removed off lt testicle   Depression    GERD (gastroesophageal reflux disease)    PONV (postoperative  nausea and vomiting)    Testicular cancer [facility])     Past Surgical History:  Procedure Laterality Date   ANTERIOR CERVICAL DECOMP/DISCECTOMY FUSION N/A 07/27/2012   Procedure: ANTERIOR CERVICAL DECOMPRESSION/DISCECTOMY FUSION CERVICALSIX-SEVEN1 LEVEL/HARDWARE REMOVAL CERVICAL FIVE-SIX;  Surgeon: Eustace Moore, MD;  Location: [facility];  Service: Neurosurgery;  Laterality: N/A;  Anterior Cervical Decompression/Discectomy Fusion, Cervical Six-Seven Fusion, Removal of hardware at Cervical Five-Six   BACK SURGERY     neck fusion   KNEE ARTHROSCOPY     Left testicle removed  Left     Current Medications: Current Meds  Medication Sig   atorvastatin (LIPITOR) 20 MG tablet Take 20 mg by mouth daily.   FLUoxetine (PROZAC) 20 MG capsule Take 20 mg by mouth daily.   lisinopril-hydrochlorothiazide (ZESTORETIC) 10-12.5 MG tablet Take 1 tablet by mouth every morning.   meclizine (ANTIVERT) 25 MG tablet Take 25 mg by mouth as needed for nausea.   omeprazole (PRILOSEC) 40 MG capsule Take 40 mg by mouth daily. Take 1-2 capsules a day.   rOPINIRole (REQUIP XL) 2 MG 24 hr tablet Take 2 mg by mouth daily.   sertraline (ZOLOFT) 100 MG tablet Take 100 mg by mouth daily.   traMADol (ULTRAM) 50 MG tablet Take 50 mg by mouth 4 (four) times daily as needed for pain.   [  DISCONTINUED] diclofenac (VOLTAREN) 75 MG EC tablet Take 75 mg by mouth 2 (two) times daily.   [DISCONTINUED] OXcarbazepine (TRILEPTAL) 150 MG tablet Take 0.5 tablets by mouth at bedtime.     Allergies:   Bee venom, Mixed ragweed, and Molds & smuts   Social History   Socioeconomic History   Marital status: Married    Spouse name: Not on file   Number of children: Not on file   Years of education: Not on file   Highest education level: Not on file  Occupational History   Not on file  Tobacco Use   Smoking status: Former   Smokeless tobacco: Not on file  Substance and Sexual Activity   Alcohol use: No   Drug use: No   Sexual  activity: Yes  Other Topics Concern   Not on file  Social History Narrative   Not on file   Social Determinants of Health   Financial Resource Strain: Not on file  Food Insecurity: Not on file  Transportation Needs: Not on file  Physical Activity: Not on file  Stress: Not on file  Social Connections: Not on file     Family History: The patient's family history includes Cancer in his father; Diabetes in his father and paternal grandfather; Heart murmur in his mother. ROS:   Please see the history of present illness.    All 14 point review of systems negative except as described per history of present illness.  EKGs/Labs/Other Studies Reviewed:    The following studies were reviewed today:   EKG:  EKG is  ordered today.  The ekg ordered today demonstrates normal sinus rhythm, normal P interval, nonspecific ST segment changes  Recent Labs: No results found for requested labs within last 8760 hours.  Recent Lipid Panel No results found for: CHOL, TRIG, HDL, CHOLHDL, VLDL, LDLCALC, LDLDIRECT  Physical Exam:    VS:  BP 100/60 (BP Location: Left Arm, Patient Position: Sitting)   Pulse 69   Ht '6\' 3"'$  (1.905 m)   Wt 205 lb (93 kg)   SpO2 97%   BMI 25.62 kg/m     Wt Readings from Last 3 Encounters:  06/16/21 205 lb (93 kg)  07/19/12 242 lb 6.4 oz (110 kg)     GEN:  Well nourished, well developed in no acute distress HEENT: Normal NECK: No JVD; No carotid bruits LYMPHATICS: No lymphadenopathy CARDIAC: RRR, no murmurs, no rubs, no gallops RESPIRATORY:  Clear to auscultation without rales, wheezing or rhonchi  ABDOMEN: Soft, non-tender, non-distended MUSCULOSKELETAL:  No edema; No deformity  SKIN: Warm and dry NEUROLOGIC:  Alert and oriented x 3 PSYCHIATRIC:  Normal affect   ASSESSMENT:    1. Essential hypertension   2. Syncope and collapse   3. Dyslipidemia   4. Tobacco use disorder   5. Bipolar II disorder, most recent episode major depressive ([place])    PLAN:     In order of problems listed above:  Recurrent episode of syncope very concerning interesting is that he does have a amnesia 4 hours after that.  I am more about him potentially having seizures especially in view of the fact he gets a traumatic brain injury as well as the fact that he takes medication for bipolar disorder  Still I must rule out potentially having arrhythmia even though symptomatology did not indicating that.  I will ask him to wear Zio patch for 2 weeks.  I will also schedule him to have echocardiogram to look at his heart  structurally make sure that there is no significant structural abnormalities.  Also today we will check his orthostatic changes as well we will check blood pressure in both arms interestingly today his blood pressure is low I will reduce his Zestoretic to only half a day.  Concern however is that few weeks ago he end up going to the hospital because of feeling dizzy and his blood pressure was very high 220/110. Dyslipidemia he is taking Lipitor 20 I did review K PN which only dated from September of last year with LDL 120 HDL 64.  We will continue present management for now. Bipolar disorder on medications seems to be stable Tobacco he quit smoking few years ago that was at the time the doctor told his wife that she got a COPD they both decided to quit but succeeded but staying away from cigarettes.  I congratulated him for it. Issue of driving is very serious 1.  I told him the state of note: He cannot drive for 6 months after episode of syncope.  Looks like last episode of syncope was in March, therefore he should not be able to drive until September if he is staying syncope free.  I clearly communicated this to him   Medication Adjustments/Labs and Tests Ordered: Current medicines are reviewed at length with the patient today.  Concerns regarding medicines are outlined above.  No orders of the defined types were placed in this encounter.  No orders of the  defined types were placed in this encounter.   Signed, Park Liter, MD, [facility]. 06/16/2021 3:19 PM    [facility]

## 2021-06-17 ENCOUNTER — Encounter: Payer: Self-pay | Admitting: Neurology

## 2021-06-18 ENCOUNTER — Telehealth: Payer: Self-pay | Admitting: Cardiology

## 2021-06-18 NOTE — Telephone Encounter (Signed)
Wife calling in to say that the heart monitor is not sticking to the patient. She states he sweats too bad, calling to see what can be done. Please advise

## 2021-06-18 NOTE — Telephone Encounter (Signed)
Spoke with [place] per DPR and advised to call Zio as they will advise them what to do. Dee verbalized understanding and had no additional questions.

## 2021-06-22 ENCOUNTER — Ambulatory Visit (INDEPENDENT_AMBULATORY_CARE_PROVIDER_SITE_OTHER): Payer: BC Managed Care – PPO

## 2021-06-22 DIAGNOSIS — F321 Major depressive disorder, single episode, moderate: Secondary | ICD-10-CM | POA: Diagnosis not present

## 2021-06-22 DIAGNOSIS — G2581 Restless legs syndrome: Secondary | ICD-10-CM | POA: Diagnosis not present

## 2021-06-22 DIAGNOSIS — R0609 Other forms of dyspnea: Secondary | ICD-10-CM

## 2021-06-22 DIAGNOSIS — I10 Essential (primary) hypertension: Secondary | ICD-10-CM | POA: Diagnosis not present

## 2021-06-22 DIAGNOSIS — K219 Gastro-esophageal reflux disease without esophagitis: Secondary | ICD-10-CM | POA: Diagnosis not present

## 2021-06-22 DIAGNOSIS — Z6826 Body mass index (BMI) 26.0-26.9, adult: Secondary | ICD-10-CM | POA: Diagnosis not present

## 2021-06-22 DIAGNOSIS — E7800 Pure hypercholesterolemia, unspecified: Secondary | ICD-10-CM | POA: Diagnosis not present

## 2021-06-22 LAB — ECHOCARDIOGRAM COMPLETE
AR max vel: 1.71 cm2
AV Area VTI: 1.8 cm2
AV Area mean vel: 1.63 cm2
AV Mean grad: 13 mmHg
AV Peak grad: 21.9 mmHg
Ao pk vel: 2.34 m/s
Area-P 1/2: 2.22 cm2
Calc EF: 43.7 %
P 1/2 time: 428 msec
S' Lateral: 3.9 cm
Single Plane A2C EF: 36.8 %
Single Plane A4C EF: 49.1 %

## 2021-06-22 NOTE — Telephone Encounter (Signed)
Zio notified us by email that this pt's zio fell off within 24 hours of having it put on and so will send it back and Zio will send pt another monitor.

## 2021-07-02 ENCOUNTER — Telehealth: Payer: Self-pay | Admitting: Cardiology

## 2021-07-02 NOTE — Telephone Encounter (Signed)
Wife called in to say that the heart monitor keeps falling off the patient. Please advise

## 2021-07-02 NOTE — Telephone Encounter (Signed)
LVM to call regarding message. 

## 2021-07-02 NOTE — Telephone Encounter (Signed)
Pt called ZIO customer service x 2 because th monitor fell off due to sweating. Pt sent ZIO back in package provided.

## 2021-07-03 DIAGNOSIS — R55 Syncope and collapse: Secondary | ICD-10-CM | POA: Diagnosis not present

## 2021-07-06 ENCOUNTER — Encounter: Payer: Self-pay | Admitting: Neurology

## 2021-07-06 ENCOUNTER — Ambulatory Visit: Payer: BC Managed Care – PPO | Admitting: Neurology

## 2021-07-06 VITALS — BP 156/73 | HR 68 | Ht 75.0 in | Wt 207.0 lb

## 2021-07-06 DIAGNOSIS — R55 Syncope and collapse: Secondary | ICD-10-CM

## 2021-07-06 DIAGNOSIS — R400 Somnolence: Secondary | ICD-10-CM

## 2021-07-06 NOTE — Progress Notes (Signed)
NEUROLOGY CONSULTATION NOTE  ZAVIYAR Jimenez MRN: [id number] DOB: 1955/06/12  Referring provider: Dr. Jenne Campus Primary care provider: Chrystie Nose, NP  Reason for consult:  syncope  Dear Dr Agustin Cree:  Thank you for your kind referral of Aaron Jimenez [facility] for consultation of the above symptoms. Although his history is well known to you, please allow me to reiterate it for the purpose of our medical record. The patient was accompanied to the clinic by his wife who also provides collateral information. Records and images were personally reviewed where available.   HISTORY OF PRESENT ILLNESS: This is a pleasant 66 year old left-handed man with a history of hypertension, hyperlipidemia, RLS, presenting for evaluation of episodes of loss of consciousness. He initially reported that he has no warning prior to the episodes and has no recollection of them, unable to give any details, however on further questioning, he did have a warning prior to one of the episodes. The first event occurred in December 2022, his wife heard him fall and came to the room immediately after, he was unconscious for only 3-4 seconds, woke up groaning and refusing to go to the hospital. He went back to bed. His wife did not see any convulsive activity, no tongue bite or incontinence. He had an episode at work last month where he started feeling sick to his stomach and alerted coworkers he needed to take a break. He went outside and got very dizzy, sat down feeling nauseated, cold, and clammy. He lost consciousness on the way back in to the breakroom. His coworkers told his wife his BP was very high (205/98), EKG with EMS reportedly normal so he declined to go to the hospital. The last episode was earlier this month, he screamed for her and she came to find him standing by the doorway, swaying unsteadily with his eyes big, then he passed out and fell down. He was out for a few seconds, he did not recognize her for 3-5 minutes  before answering questions correctly. He has not had any further episodes of loss of consciousness since then but gets dizzy a lot, he describes it as "like being on a ship on water." There is no clear positional component to the dizziness. All of the syncopal episodes have occurred while standing. No associated nausea/vomiting, diplopia, dysarthria/dysphagia, focal numbness/tingling/weakness. He has chronic intermittent left hand numbness/tingling since neck surgery in 2014, unchanged. His wife denies any staring/unresponsive episodes.   They report a history of head injury 3 years ago while at work, he was hit by a wrench and sustained facial fractures. The last thing he recalls was looking at his watch at 11am. He does not recall driving home from [place], his wife took him to the hospital. Since then, he lost his sense of taste/smell. His wife also reports that "everything about him changed," his personality changed, he had problems remembering things, losing track of time, and would get mad really easily, throwing things. He used to be deathly afraid of snakes, but since then he would pick them up without fear. He had several concussions when younger. He has headaches that start in the back of his head radiating forward, taking Advil with good response. He has RLS and both legs jump sometimes. He has poor quality sleep, waking up after 30 minutes of sleep. They feel he only gets 2 hours in total. He has daytime drowsiness and feels tired all the time. He snores very loudly. He has been on Ropinirole for RLS  for the past 4-5 years. He gets bad leg cramps, pickle juice helps. He has right shoulder pain and takes Tramadol prn. He had a normal birth and early development.  There is no history of febrile convulsions, CNS infections such as meningitis/encephalitis,neurosurgical procedures, or family history of seizures.   PAST MEDICAL HISTORY: Past Medical History:  Diagnosis Date   Anxiety    Cancer ([place])     tumor removed off lt testicle   Depression    GERD (gastroesophageal reflux disease)    PONV (postoperative nausea and vomiting)    Testicular cancer ([place])     PAST SURGICAL HISTORY: Past Surgical History:  Procedure Laterality Date   ANTERIOR CERVICAL DECOMP/DISCECTOMY FUSION N/A 07/27/2012   Procedure: ANTERIOR CERVICAL DECOMPRESSION/DISCECTOMY FUSION CERVICALSIX-SEVEN1 LEVEL/HARDWARE REMOVAL CERVICAL FIVE-SIX;  Surgeon: Eustace Moore, MD;  Location: [facility];  Service: Neurosurgery;  Laterality: N/A;  Anterior Cervical Decompression/Discectomy Fusion, Cervical Six-Seven Fusion, Removal of hardware at Cervical Five-Six   BACK SURGERY     neck fusion   KNEE ARTHROSCOPY     Left testicle removed  Left    NOSE SURGERY     2020   TOE AMPUTATION Right 04/18/2020   index toe    MEDICATIONS: Current Outpatient Medications on File Prior to Visit  Medication Sig Dispense Refill   atorvastatin (LIPITOR) 20 MG tablet Take 20 mg by mouth daily.     EPINEPHrine 0.3 mg/0.3 mL IJ SOAJ injection Inject 0.3 mg into the muscle as needed for anaphylaxis.     FLUoxetine (PROZAC) 20 MG capsule Take 20 mg by mouth daily.     lisinopril-hydrochlorothiazide (ZESTORETIC) 10-12.5 MG tablet Take 1 tablet by mouth every morning.     omeprazole (PRILOSEC) 40 MG capsule Take 40 mg by mouth daily. Take 1-2 capsules a day.     rOPINIRole (REQUIP XL) 2 MG 24 hr tablet Take 2 mg by mouth daily.     sertraline (ZOLOFT) 100 MG tablet Take 100 mg by mouth daily.     traMADol (ULTRAM) 50 MG tablet Take 50 mg by mouth 4 (four) times daily as needed for pain.     meclizine (ANTIVERT) 25 MG tablet Take 25 mg by mouth as needed for nausea. (Patient not taking: Reported on 07/06/2021)     No current facility-administered medications on file prior to visit.    ALLERGIES: Allergies  Allergen Reactions   Bee Venom Anaphylaxis   Mixed Ragweed Shortness Of Breath   Molds & Smuts Other (See Comments)    Wheezing     FAMILY HISTORY: Family History  Problem Relation Age of Onset   Heart murmur Mother    Diabetes Father    Cancer Father    Diabetes Paternal Grandfather     SOCIAL HISTORY: Social History   Socioeconomic History   Marital status: Married    Spouse name: Not on file   Number of children: Not on file   Years of education: Not on file   Highest education level: Not on file  Occupational History   Not on file  Tobacco Use   Smoking status: Former   Smokeless tobacco: Not on file  Vaping Use   Vaping Use: Never used  Substance and Sexual Activity   Alcohol use: Yes    Comment: rare   Drug use: No   Sexual activity: Yes  Other Topics Concern   Not on file  Social History Narrative   Left handed    Lives with wife one  story home   Caffeine a pot a day or more.   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 Left knee in brace Neurological Exam: Mental status: alert and awake, no dysarthria or aphasia, Fund of knowledge is appropriate.  Attention and concentration are normal.   Cranial nerves: CN I: not tested CN II: pupils equal, round and reactive to light, visual fields intact CN III, IV, VI:  full range of motion, no nystagmus, no ptosis CN V: facial sensation intact CN VII: upper and lower face symmetric CN VIII: hearing intact to conversation Bulk & Tone: normal, no fasciculations. Motor: 5/5 throughout with no pronator drift. Sensation: intact to light touch, cold, pin on both UE. Intact pin on both LE. Dec cold to ankles,  dec vibration sense to knees bilaterally.  Deep Tendon Reflexes: +1 throughout Cerebellar: no incoordination on finger to nose testing Gait: slow and cautious, no ataxia Tremor: none   IMPRESSION: This is a pleasant 66 year old left-handed man with a history of hypertension, hyperlipidemia, RLS, presenting for evaluation of episodes of loss of consciousness, etiology unclear. It appears he had post-concussive symptoms (cognitive and behavioral changes) after a head injury 3 years ago, however the syncopal episodes started in December 2022. His neurological exam shows mild length-dependent neuropathy, otherwise non-focal. We discussed different causes of syncope, from a neurological standpoint, we will do a brain MRI with and without contrast and routine EEG. He was advised to proceed with cardiac monitoring as recommended, he has been unable to keep the holter monitor on for longer than 3-4 days due to excessive sweating. He will discuss other options with his cardiologist. If brain MRI, routine EEG, and cardiac monitoring are unremarkable, we can do an inpatient video EEG study for symptom characterization. He would not be able to complete an ambulatory EEG due to the excessive sweating issue. They also report poor sleep with daytime drowsiness and loud snoring, in-lab sleep study will be ordered to assess for OSA. [place] driving laws were discussed with the patient, and he knows to stop driving after an episode of loss of consciousness until 6 months event-free. Follow-up after tests, call for any changes.    Thank you for allowing me to participate in the care of this patient. Please do not hesitate to call for any questions or concerns.   Ellouise Newer, M.D.  CC: Dr. Agustin Cree

## 2021-07-06 NOTE — Addendum Note (Signed)
Addended by: Renae Gloss on: 07/06/2021 01:25 PM   Modules accepted: Orders

## 2021-07-06 NOTE — Patient Instructions (Signed)
Good to meet you.  Schedule MRI brain with and without contrast at [facility]  2. Schedule in-lab sleep study  3. Schedule EEG  4. It is prudent to recommend that all persons should be free of syncopal episodes for at least six months to be granted the driving privilege." ([name], Second Edition, Medical Review Branch, Engineer, site, Division of [employer] of Transportation, July 2004)   5. Follow-up after tests, call for any changes

## 2021-07-10 ENCOUNTER — Telehealth: Payer: Self-pay | Admitting: Neurology

## 2021-08-19 ENCOUNTER — Ambulatory Visit: Payer: BC Managed Care – PPO | Admitting: Cardiology

## 2021-08-19 ENCOUNTER — Encounter: Payer: Self-pay | Admitting: Cardiology

## 2021-08-19 VITALS — BP 122/84 | HR 66 | Ht 75.0 in | Wt 205.2 lb

## 2021-08-19 DIAGNOSIS — F3181 Bipolar II disorder: Secondary | ICD-10-CM | POA: Diagnosis not present

## 2021-08-19 DIAGNOSIS — R55 Syncope and collapse: Secondary | ICD-10-CM

## 2021-08-19 DIAGNOSIS — I7121 Aneurysm of the ascending aorta, without rupture: Secondary | ICD-10-CM

## 2021-08-19 DIAGNOSIS — I10 Essential (primary) hypertension: Secondary | ICD-10-CM | POA: Diagnosis not present

## 2021-08-19 DIAGNOSIS — E785 Hyperlipidemia, unspecified: Secondary | ICD-10-CM | POA: Diagnosis not present

## 2021-08-19 HISTORY — DX: Aneurysm of the ascending aorta, without rupture: I71.21

## 2021-08-19 NOTE — Patient Instructions (Signed)
Medication Instructions:  Your physician recommends that you continue on your current medications as directed. Please refer to the Current Medication list given to you today.  *If you need a refill on your cardiac medications before your next appointment, please call your pharmacy*   Lab Work: BMET today in preparation for CT of Chest   Testing/Procedures: Non-Cardiac CT Angiography (CTA), is a special type of CT scan that uses a computer to produce multi-dimensional views of major blood vessels throughout the body. In CT angiography, a contrast material is injected through an IV to help visualize the blood vessels   Your physician has requested that you have an abdominal aorta duplex. During this test, an ultrasound is used to evaluate the aorta. Allow 30 minutes for this exam. Do not eat after midnight the day before and avoid carbonated beverages    Follow-Up: At [facility], you and your health needs are our priority.  As part of our continuing mission to provide you with exceptional heart care, we have created designated Provider Care Teams.  These Care Teams include your primary Cardiologist (physician) and Advanced Practice Providers (APPs -  Physician Assistants and Nurse Practitioners) who all work together to provide you with the care you need, when you need it.  We recommend signing up for the patient portal called "MyChart".  Sign up information is provided on this After Visit Summary.  MyChart is used to connect with patients for Virtual Visits (Telemedicine).  Patients are able to view lab/test results, encounter notes, upcoming appointments, etc.  Non-urgent messages can be sent to your provider as well.   To learn more about what you can do with MyChart, go to [email or web address].    Your next appointment:   3 month(s)  The format for your next appointment:   In Person  Provider:   Jenne Campus, MD    Other Instructions NA

## 2021-08-19 NOTE — Progress Notes (Unsigned)
Cardiology Office Note:    Date:  08/19/2021   ID:  Aaron BISCHOF, DOB 1955/06/10, MRN [id number]  PCP:  Esperanza Richters, NP  Cardiologist:  Jenne Campus, MD    Referring MD: Esperanza Richters, NP   Chief Complaint  Patient presents with   Follow-up  Doing fine, did not have any more syncope since last time  History of Present Illness:    Aaron Jimenez is a 66 y.o. male with past medical history significant for essential hypertension, dyslipidemia, smoking.  Also bipolar disorder who was referred to [place] because of her recurrent episode of syncope.  So far cardiac work-up has been negative.  He did wear a Zio patch for almost 2 weeks 8 was showing only supraventricular tachycardia not think that could explain his syncope, however, he did not have any episodes of his near syncope and syncope while wearing the monitor.  He also had echocardiogram which showed normal left ventricle ejection fraction, there is enlargement of the aorta 43 mm, there is also mentioning about severely enlarged left atrium which I do not think is the case I did review echocardiogram myself I do not see this enlargement.  He said he is doing well otherwise.  He did see neurology I did review consultation and appreciate their expertise.  Would be beneficial to have EEG done as well as MRI of the brain he is waiting for those test.  Past Medical History:  Diagnosis Date   Anxiety    Cancer ([place])    tumor removed off lt testicle   Depression    GERD (gastroesophageal reflux disease)    PONV (postoperative nausea and vomiting)    Testicular cancer [facility])     Past Surgical History:  Procedure Laterality Date   ANTERIOR CERVICAL DECOMP/DISCECTOMY FUSION N/A 07/27/2012   Procedure: ANTERIOR CERVICAL DECOMPRESSION/DISCECTOMY FUSION CERVICALSIX-SEVEN1 LEVEL/HARDWARE REMOVAL CERVICAL FIVE-SIX;  Surgeon: Eustace Moore, MD;  Location: [facility];  Service: Neurosurgery;  Laterality: N/A;  Anterior Cervical  Decompression/Discectomy Fusion, Cervical Six-Seven Fusion, Removal of hardware at Cervical Five-Six   BACK SURGERY     neck fusion   KNEE ARTHROSCOPY     Left testicle removed  Left    NOSE SURGERY     2020   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   EPINEPHrine 0.3 mg/0.3 mL IJ SOAJ injection Inject 0.3 mg into the muscle as needed for anaphylaxis.   FLUoxetine (PROZAC) 20 MG capsule Take 20 mg by mouth daily.   lisinopril-hydrochlorothiazide (ZESTORETIC) 10-12.5 MG tablet Take 1 tablet by mouth every morning.   meclizine (ANTIVERT) 25 MG tablet Take 25 mg by mouth as needed for nausea.   omeprazole (PRILOSEC) 40 MG capsule Take 40 mg by mouth daily. Take 1-2 capsules a day.   rOPINIRole (REQUIP XL) 2 MG 24 hr tablet Take 2 mg by mouth daily.   sertraline (ZOLOFT) 100 MG tablet Take 100 mg by mouth daily.   traMADol (ULTRAM) 50 MG tablet Take 50 mg by mouth 4 (four) times daily as needed for pain.     Allergies:   Bee venom, Mixed ragweed, and Molds & smuts   Social History   Socioeconomic History   Marital status: Married    Spouse name: Not on file   Number of children: Not on file   Years of education:  Not on file   Highest education level: Not on file  Occupational History   Not on file  Tobacco Use   Smoking status: Former   Smokeless tobacco: Not on file  Vaping Use   Vaping Use: Never used  Substance and Sexual Activity   Alcohol use: Yes    Comment: rare   Drug use: No   Sexual activity: Yes  Other Topics Concern   Not on file  Social History Narrative   Left handed    Lives with wife one story home   Caffeine a pot a day or more.   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 ROS:   Please see the history of present illness.    All 14 point review of systems negative except as described per history of present illness  EKGs/Labs/Other Studies Reviewed:      Recent Labs: No results found for requested labs within last 365 days.  Recent Lipid Panel No results found for: "CHOL", "TRIG", "HDL", "CHOLHDL", "VLDL", "LDLCALC", "LDLDIRECT"  Physical Exam:    VS:  BP 122/84 (BP Location: Left Arm, Patient Position: Sitting)   Pulse 66   Ht '6\' 3"'$  (1.905 m)   Wt 205 lb 3.2 oz (93.1 kg)   SpO2 95%   BMI 25.65 kg/m     Wt Readings from Last 3 Encounters:  08/19/21 205 lb 3.2 oz (93.1 kg)  07/06/21 207 lb (93.9 kg)  06/16/21 205 lb (93 kg)     GEN:  Well nourished, well developed in no acute distress HEENT: Normal NECK: No JVD; No carotid bruits LYMPHATICS: No lymphadenopathy CARDIAC: RRR, no murmurs, no rubs, no gallops RESPIRATORY:  Clear to auscultation without rales, wheezing or rhonchi  ABDOMEN: Soft, non-tender, non-distended MUSCULOSKELETAL:  No edema; No deformity  SKIN: Warm and dry LOWER EXTREMITIES: no swelling NEUROLOGIC:  Alert and oriented x 3 PSYCHIATRIC:  Normal affect   ASSESSMENT:    1. Essential hypertension   2. Aneurysm of ascending aorta without rupture ([place])   3. Bipolar II disorder, most recent episode major depressive ([place])   4. Dyslipidemia   5. Syncope and collapse    PLAN:    In order of problems listed above:  Essential hypertension blood pressure seems to be well controlled continue present management. Aneurysm of the ascending aorta incidental discovery need to have a CT angio of his chest to look at that thoracic aorta as well as abdominal ultrasounds to look at the aorta in his abdomen to make sure  were not missing any arteries some mild. Syncope.  I will refer him to our EP colleagues to talk about potentially implantable loop recorder.  In the meantime he does have neurology work-up.  I will not pursue loop implantation until neurology work-up will be completed Dyslipidemia he is taking Lipitor.  We will recheck his cholesterol later   Medication Adjustments/Labs and Tests Ordered: Current medicines are reviewed at length with the patient today.  Concerns regarding medicines are outlined above.  No orders of the defined types were placed in this encounter.  Medication changes: No  orders of the defined types were placed in this encounter.   Signed, Park Liter, MD, [facility] 08/19/2021 1:21 PM    [place]

## 2021-08-20 LAB — BASIC METABOLIC PANEL
BUN/Creatinine Ratio: 17 (ref 10–24)
BUN: 18 mg/dL (ref 8–27)
CO2: 21 mmol/L (ref 20–29)
Calcium: 8.8 mg/dL (ref 8.6–10.2)
Chloride: 107 mmol/L — ABNORMAL HIGH (ref 96–106)
Creatinine, Ser: 1.03 mg/dL (ref 0.76–1.27)
Glucose: 89 mg/dL (ref 70–99)
Potassium: 4.3 mmol/L (ref 3.5–5.2)
Sodium: 143 mmol/L (ref 134–144)
eGFR: 80 mL/min/{1.73_m2} (ref >59)

## 2021-08-24 DIAGNOSIS — I700 Atherosclerosis of aorta: Secondary | ICD-10-CM | POA: Diagnosis not present

## 2021-08-24 DIAGNOSIS — I712 Thoracic aortic aneurysm, without rupture, unspecified: Secondary | ICD-10-CM | POA: Diagnosis not present

## 2021-08-24 DIAGNOSIS — I2510 Atherosclerotic heart disease of native coronary artery without angina pectoris: Secondary | ICD-10-CM | POA: Diagnosis not present

## 2021-08-24 DIAGNOSIS — I358 Other nonrheumatic aortic valve disorders: Secondary | ICD-10-CM | POA: Diagnosis not present

## 2021-08-24 DIAGNOSIS — I7121 Aneurysm of the ascending aorta, without rupture: Secondary | ICD-10-CM | POA: Diagnosis not present

## 2021-08-25 ENCOUNTER — Ambulatory Visit (INDEPENDENT_AMBULATORY_CARE_PROVIDER_SITE_OTHER): Payer: BC Managed Care – PPO

## 2021-08-25 ENCOUNTER — Telehealth: Payer: Self-pay

## 2021-08-25 DIAGNOSIS — I7121 Aneurysm of the ascending aorta, without rupture: Secondary | ICD-10-CM | POA: Diagnosis not present

## 2021-08-25 NOTE — Telephone Encounter (Signed)
Spoke with patient notified of results and recommendations to call back in a year and set up an appt,.

## 2021-08-25 NOTE — Telephone Encounter (Signed)
Patient notified of results.

## 2021-08-25 NOTE — Telephone Encounter (Signed)
-----   Message from Park Liter, MD sent at 08/25/2021 12:32 PM EDT ----- Regarding: RE: CT results Ascending aorta measuring 41 mm.  Medical therapy and yearly follow-up with either CT or echo ----- Message ----- From: Darrel Reach, CMA Sent: 08/25/2021   9:55 AM EDT To: Park Liter, MD Subject: CT results                                     CT Angio results under media, look for today's date and advise

## 2021-08-25 NOTE — Telephone Encounter (Signed)
-----   Message from Park Liter, MD sent at 08/24/2021  9:37 AM EDT ----- Chem-7 looks good

## 2021-09-11 ENCOUNTER — Telehealth: Payer: Self-pay | Admitting: Cardiology

## 2021-09-11 NOTE — Telephone Encounter (Signed)
Left vm to call back

## 2021-09-11 NOTE — Telephone Encounter (Signed)
Pt's wife is returning call.  Transferred to [place], Therapist, sports.

## 2021-09-11 NOTE — Telephone Encounter (Signed)
Pt wife returning a call for results.

## 2021-09-11 NOTE — Telephone Encounter (Signed)
Unable to see a note for a call on 09/09/21. Pt's wife states he will not have a loop recorder placed and will not do a sleep study.

## 2021-09-24 ENCOUNTER — Telehealth: Payer: Self-pay

## 2021-09-24 NOTE — Patient Outreach (Signed)
  Care Coordination   09/24/2021 Name: Aaron Jimenez MRN: [id number] DOB: [birth year]   Care Coordination Outreach Attempts:  An unsuccessful telephone outreach was attempted today to offer the patient information about available care coordination services as a benefit of their health plan.   Follow Up Plan:  Additional outreach attempts will be made to offer the patient care coordination information and services.   Encounter Outcome:  No Answer  Care Coordination Interventions Activated:  No   Care Coordination Interventions:  No, not indicated    Tomasa Rand, RN, BSN, CEN [facility] [employer] [phone]

## 2021-10-06 DIAGNOSIS — R509 Fever, unspecified: Secondary | ICD-10-CM | POA: Diagnosis not present

## 2021-10-06 DIAGNOSIS — J0100 Acute maxillary sinusitis, unspecified: Secondary | ICD-10-CM | POA: Diagnosis not present

## 2021-10-13 ENCOUNTER — Institutional Professional Consult (permissible substitution): Payer: BC Managed Care – PPO | Admitting: Cardiology

## 2021-10-22 ENCOUNTER — Telehealth: Payer: Self-pay

## 2021-10-22 NOTE — Patient Outreach (Signed)
  Care Coordination   10/22/2021 Name: Aaron Jimenez MRN: [id number] DOB: [birth year]   Care Coordination Outreach Attempts:  A second unsuccessful outreach was attempted today to offer the patient with information about available care coordination services as a benefit of their health plan.     Follow Up Plan:  Additional outreach attempts will be made to offer the patient care coordination information and services.   Encounter Outcome:  No Answer  Care Coordination Interventions Activated:  No   Care Coordination Interventions:  No, not indicated    Tomasa Rand, RN, BSN, CEN [facility] [employer] [phone]

## 2021-10-26 ENCOUNTER — Telehealth: Payer: Self-pay

## 2021-10-26 NOTE — Patient Outreach (Signed)
  Care Coordination   10/26/2021 Name: Aaron Jimenez MRN: [id number] DOB: 15-Jul-1955   Care Coordination Outreach Attempts:  A third unsuccessful outreach was attempted today to offer the patient with information about available care coordination services as a benefit of their health plan.   Follow Up Plan:  No further outreach attempts will be made at this time. We have been unable to contact the patient to offer or enroll patient in care coordination services  Encounter Outcome:  No Answer  Care Coordination Interventions Activated:  No   Care Coordination Interventions:  No, not indicated    Tomasa Rand, RN, BSN, CEN [place] Coordinator [phone]

## 2021-11-09 DIAGNOSIS — M25531 Pain in right wrist: Secondary | ICD-10-CM | POA: Diagnosis not present

## 2021-11-09 DIAGNOSIS — Z1810 Retained metal fragments, unspecified: Secondary | ICD-10-CM | POA: Diagnosis not present

## 2021-11-09 DIAGNOSIS — W208XXA Other cause of strike by thrown, projected or falling object, initial encounter: Secondary | ICD-10-CM | POA: Diagnosis not present

## 2021-11-09 DIAGNOSIS — S76211A Strain of adductor muscle, fascia and tendon of right thigh, initial encounter: Secondary | ICD-10-CM | POA: Diagnosis not present

## 2021-11-09 DIAGNOSIS — S63501A Unspecified sprain of right wrist, initial encounter: Secondary | ICD-10-CM | POA: Diagnosis not present

## 2021-11-09 DIAGNOSIS — Z23 Encounter for immunization: Secondary | ICD-10-CM | POA: Diagnosis not present

## 2021-11-09 DIAGNOSIS — M19031 Primary osteoarthritis, right wrist: Secondary | ICD-10-CM | POA: Diagnosis not present

## 2021-11-09 DIAGNOSIS — S60851A Superficial foreign body of right wrist, initial encounter: Secondary | ICD-10-CM | POA: Diagnosis not present

## 2021-11-09 DIAGNOSIS — S61541A Puncture wound with foreign body of right wrist, initial encounter: Secondary | ICD-10-CM | POA: Diagnosis not present

## 2021-11-10 DIAGNOSIS — S60851A Superficial foreign body of right wrist, initial encounter: Secondary | ICD-10-CM | POA: Diagnosis not present

## 2021-11-19 ENCOUNTER — Ambulatory Visit: Payer: BC Managed Care – PPO | Attending: Cardiology | Admitting: Cardiology

## 2021-11-19 ENCOUNTER — Encounter: Payer: Self-pay | Admitting: Cardiology

## 2021-11-19 VITALS — BP 148/72 | HR 73 | Ht 75.0 in | Wt 189.8 lb

## 2021-11-19 DIAGNOSIS — E785 Hyperlipidemia, unspecified: Secondary | ICD-10-CM

## 2021-11-19 DIAGNOSIS — I7121 Aneurysm of the ascending aorta, without rupture: Secondary | ICD-10-CM

## 2021-11-19 DIAGNOSIS — F3181 Bipolar II disorder: Secondary | ICD-10-CM

## 2021-11-19 DIAGNOSIS — R55 Syncope and collapse: Secondary | ICD-10-CM | POA: Diagnosis not present

## 2021-11-19 DIAGNOSIS — I10 Essential (primary) hypertension: Secondary | ICD-10-CM | POA: Diagnosis not present

## 2021-11-19 NOTE — Progress Notes (Signed)
Cardiology Office Note:    Date:  11/19/2021   ID:  Aaron Jimenez, DOB 1955/12/28, MRN [id number]  PCP:  Esperanza Richters, NP  Cardiologist:  Jenne Campus, MD    Referring MD: Esperanza Richters, NP   Chief Complaint  Patient presents with   Follow-up  Doing fine  History of Present Illness:    Aaron Jimenez is a 66 y.o. male with past medical history significant for essential hypertension, dyslipidemia, smoking, bipolar disorder he was referred to [place] because of episode of syncope.  So far work-up has been negative.  He were Zio patch which showed only some supraventricular tachycardia not enough to justify his episode of syncope, also had echocardiogram showed normal left ventricle ejection fraction he was found to have enlargement of the aorta after that CT of the chest has been performed which showed diameter of the aorta 41 mm, ultrasounds of his abdomen has been done which showed no significant enlargement of the abdominal arctic he supposed to see a also EP team for consideration of implantable loop recorder but he never done it and he does not think he needed.  He did not passed out since I seen him last time  Past Medical History:  Diagnosis Date   Anxiety    Cancer ([place])    tumor removed off lt testicle   Depression    GERD (gastroesophageal reflux disease)    PONV (postoperative nausea and vomiting)    Testicular cancer [facility])     Past Surgical History:  Procedure Laterality Date   ANTERIOR CERVICAL DECOMP/DISCECTOMY FUSION N/A 07/27/2012   Procedure: ANTERIOR CERVICAL DECOMPRESSION/DISCECTOMY FUSION CERVICALSIX-SEVEN1 LEVEL/HARDWARE REMOVAL CERVICAL FIVE-SIX;  Surgeon: Eustace Moore, MD;  Location: [place];  Service: Neurosurgery;  Laterality: N/A;  Anterior Cervical Decompression/Discectomy Fusion, Cervical Six-Seven Fusion, Removal of hardware at Cervical Five-Six   BACK SURGERY     neck fusion   KNEE ARTHROSCOPY     Left testicle removed  Left    NOSE  SURGERY     2020   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   EPINEPHrine 0.3 mg/0.3 mL IJ SOAJ injection Inject 0.3 mg into the muscle as needed for anaphylaxis.   FLUoxetine (PROZAC) 20 MG capsule Take 20 mg by mouth daily.   ibuprofen (ADVIL) 800 MG tablet Take 800 mg by mouth 3 (three) times daily.   lisinopril-hydrochlorothiazide (ZESTORETIC) 10-12.5 MG tablet Take 1 tablet by mouth every morning.   meclizine (ANTIVERT) 25 MG tablet Take 25 mg by mouth as needed for nausea.   methocarbamol (ROBAXIN) 500 MG tablet Take 500 mg by mouth every 8 (eight) hours as needed for muscle spasms.   omeprazole (PRILOSEC) 40 MG capsule Take 40 mg by mouth daily. Take 1-2 capsules a day.   rOPINIRole (REQUIP XL) 2 MG 24 hr tablet Take 2 mg by mouth daily.   sertraline (ZOLOFT) 100 MG tablet Take 100 mg by mouth daily.   traMADol (ULTRAM) 50 MG tablet Take 50 mg by mouth 4 (four) times daily as needed for pain.     Allergies:   Bee venom, Mixed ragweed, and Molds & smuts   Social History   Socioeconomic History   Marital status: Married    Spouse name: Not on file   Number of children: Not on file   Years of education:  Not on file   Highest education level: Not on file  Occupational History   Not on file  Tobacco Use   Smoking status: Former   Smokeless tobacco: Not on file  Vaping Use   Vaping Use: Never used  Substance and Sexual Activity   Alcohol use: Yes    Comment: rare   Drug use: No   Sexual activity: Yes  Other Topics Concern   Not on file  Social History Narrative   Left handed    Lives with wife one story home   Caffeine a pot a day or more.   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 ROS:   Please see the history of present illness.    All 14 point review of systems negative except as described per history of present illness  EKGs/Labs/Other Studies Reviewed:      Recent Labs: 08/19/2021: BUN 18; Creatinine, Ser 1.03; Potassium 4.3; Sodium 143  Recent Lipid Panel No results found for: "CHOL", "TRIG", "HDL", "CHOLHDL", "VLDL", "LDLCALC", "LDLDIRECT"  Physical Exam:    VS:  BP (!) 148/72 (BP Location: Left Arm, Patient Position: Sitting)   Pulse 73   Ht '6\' 3"'$  (1.905 m)   Wt 189 lb 12.8 oz (86.1 kg)   SpO2 98%   BMI 23.72 kg/m     Wt Readings from Last 3 Encounters:  11/19/21 189 lb 12.8 oz (86.1 kg)  08/19/21 205 lb 3.2 oz (93.1 kg)  07/06/21 207 lb (93.9 kg)     GEN:  Well nourished, well developed in no acute distress HEENT: Normal NECK: No JVD; No carotid bruits LYMPHATICS: No lymphadenopathy CARDIAC: RRR, no murmurs, no rubs, no gallops RESPIRATORY:  Clear to auscultation without rales, wheezing or rhonchi  ABDOMEN: Soft, non-tender, non-distended MUSCULOSKELETAL:  No edema; No deformity  SKIN: Warm and dry LOWER EXTREMITIES: no swelling NEUROLOGIC:  Alert and oriented x 3 PSYCHIATRIC:  Normal affect   ASSESSMENT:    1. Aneurysm of ascending aorta without rupture ([place])   2. Essential hypertension   3. Syncope and collapse   4. Dyslipidemia   5. Bipolar II disorder, most recent episode major depressive ([place])    PLAN:    In order of problems listed above:  Syncope still unclear origin, he does not have anymore.  I referred him to EP to have implantable loop recorder, because he does not want it, I told him if he will have another episode of syncope he must let me know and at that time we will push towards having implantable loop  recorder inserted. Essential hypertension uncontrolled.  I will maximize his medical therapy today however he does not want to do it he said he got injured to his arm and still hurting a lot probably does not contribute to his blood pressure.  We will continue following Aneurysm of the ascending aorta I told him that this is critically essential to get his blood pressure under control.  He will follow-up with his primary care physician for that.  She will require periodic echocardiogram to assess aortic size Dyslipidemia I did review  his K PN which only his LDL of 131 HDL 68 this is from summer of this year he is taking Lipitor 20 I will recheck his fasting lipid profile today Bipolar disorder, stable   Medication Adjustments/Labs and Tests Ordered: Current medicines are reviewed at length with the patient today.  Concerns regarding medicines are outlined above.  No orders of the defined types were placed in this encounter.  Medication changes: No orders of the defined types were placed in this encounter.   Signed, Park Liter, MD, [facility] 11/19/2021 1:55 PM    [place]

## 2021-11-19 NOTE — Patient Instructions (Signed)
Medication Instructions:  Your physician recommends that you continue on your current medications as directed. Please refer to the Current Medication list given to you today.  *If you need a refill on your cardiac medications before your next appointment, please call your pharmacy*   Lab Work: Lipid, AST, ALT- today If you have labs (blood work) drawn today and your tests are completely normal, you will receive your results only by: [place] (if you have MyChart) OR A paper copy in the mail If you have any lab test that is abnormal or we need to change your treatment, we will call you to review the results.   Testing/Procedures: None Ordered   Follow-Up: At [facility], you and your health needs are our priority.  As part of our continuing mission to provide you with exceptional heart care, we have created designated Provider Care Teams.  These Care Teams include your primary Cardiologist (physician) and Advanced Practice Providers (APPs -  Physician Assistants and Nurse Practitioners) who all work together to provide you with the care you need, when you need it.  We recommend signing up for the patient portal called "MyChart".  Sign up information is provided on this After Visit Summary.  MyChart is used to connect with patients for Virtual Visits (Telemedicine).  Patients are able to view lab/test results, encounter notes, upcoming appointments, etc.  Non-urgent messages can be sent to your provider as well.   To learn more about what you can do with MyChart, go to [email or web address].    Your next appointment:   6 month(s)  The format for your next appointment:   In Person  Provider:   Jenne Campus, MD    Other Instructions NA

## 2021-11-20 LAB — LIPID PANEL
Chol/HDL Ratio: 3.4 ratio (ref 0.0–5.0)
Cholesterol, Total: 216 mg/dL — ABNORMAL HIGH (ref 100–199)
HDL: 64 mg/dL (ref >39)
LDL Chol Calc (NIH): 131 mg/dL — ABNORMAL HIGH (ref 0–99)
Triglycerides: 118 mg/dL (ref 0–149)
VLDL Cholesterol Cal: 21 mg/dL (ref 5–40)

## 2021-11-20 LAB — ALT: ALT: 13 IU/L (ref 0–44)

## 2021-11-20 LAB — AST: AST: 18 IU/L (ref 0–40)

## 2021-11-23 DIAGNOSIS — S60851A Superficial foreign body of right wrist, initial encounter: Secondary | ICD-10-CM | POA: Diagnosis not present

## 2021-11-24 ENCOUNTER — Telehealth: Payer: Self-pay

## 2021-11-24 DIAGNOSIS — E785 Hyperlipidemia, unspecified: Secondary | ICD-10-CM

## 2021-11-24 MED ORDER — ATORVASTATIN CALCIUM 40 MG PO TABS: 40.0000 mg | ORAL_TABLET | Freq: Every day | ORAL | 3 refills | Status: DC

## 2021-11-24 NOTE — Telephone Encounter (Signed)
Results reviewed with pt's spouse Karena Addison- per DPR- as per Dr. Wendy Poet note.  Pt's spouse  verbalized understanding and had no additional questions. Increased dose of Lipitor '40mg'$  sent to pharmacy, pt will return in 6 weeks for labs. Routed to PCP

## 2022-04-03 DIAGNOSIS — R079 Chest pain, unspecified: Secondary | ICD-10-CM

## 2022-04-03 DIAGNOSIS — I340 Nonrheumatic mitral (valve) insufficiency: Secondary | ICD-10-CM

## 2022-09-30 DIAGNOSIS — L89892 Pressure ulcer of other site, stage 2: Secondary | ICD-10-CM | POA: Diagnosis not present

## 2022-10-20 ENCOUNTER — Ambulatory Visit: Payer: Medicare HMO | Admitting: Podiatry

## 2022-10-20 DIAGNOSIS — M79674 Pain in right toe(s): Secondary | ICD-10-CM

## 2022-10-20 DIAGNOSIS — L97511 Non-pressure chronic ulcer of other part of right foot limited to breakdown of skin: Secondary | ICD-10-CM | POA: Diagnosis not present

## 2022-10-20 DIAGNOSIS — B351 Tinea unguium: Secondary | ICD-10-CM | POA: Diagnosis not present

## 2022-10-20 DIAGNOSIS — M79675 Pain in left toe(s): Secondary | ICD-10-CM | POA: Diagnosis not present

## 2022-10-20 MED ORDER — CICLOPIROX 8 % EX SOLN: Freq: Every day | CUTANEOUS | 11 refills | Status: DC

## 2022-10-20 NOTE — Progress Notes (Signed)
Chief Complaint  Patient presents with   Wound Check    Patient has dry peeling skin to his right hallux. Area looks healed. Patient stated he had pus from the are a couple of weeks ago. He is not taking any antibiotics. He has been applying silvadene prescribed by PCP.    HPI: 67 y.o. male presents today for follow-up of a recent wound to the right great toe in which the PCP provide care for.  He has not been seen here since 2023.  Patient was seen by PCP and has had a recurring issue of hard skin that builds up on the right hallux that eventually softens/ulcerates and then gets infected.  He had pus come out of it about a month ago and PCP had him start with Silvadene cream.  He notes that today everything is closed and is just roughened callus.  He does work on the callus at home.  He got some new shoes recently will that have a stiff sole and states that he has been feeling better.  They are a 4D width and they were purchased at [facility].  The insole is not removable.   He has secondary c/o a fungal left great toenail.  He's interested in starting treatment for this.  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[facility])     Past Surgical History:  Procedure Laterality Date   ANTERIOR CERVICAL DECOMP/DISCECTOMY FUSION N/A 07/27/2012   Procedure: ANTERIOR CERVICAL DECOMPRESSION/DISCECTOMY FUSION CERVICALSIX-SEVEN1 LEVEL/HARDWARE REMOVAL CERVICAL FIVE-SIX;  Surgeon: Tia Alert, MD;  Location: [facility];  Service: Neurosurgery;  Laterality: N/A;  Anterior Cervical Decompression/Discectomy Fusion, Cervical Six-Seven Fusion, Removal of hardware at Cervical Five-Six   BACK SURGERY     neck fusion   KNEE ARTHROSCOPY     Left testicle removed  Left    NOSE SURGERY     2020   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  Dermatology: Skin is warm, dry and supple bilateral lower extremities. Interspaces are clear of maceration and debris.  Left hallux nail is 3mm thick, yellow/brown, with subungual debris and distal onycholysis.  There is pain with compression of the nail.  The left hallux has peeling/rough skin present at plantar-distal aspect.  This appears to be recently healed.  There is no ulceration or clinical signs of infection.  No pain on palpation.  Vascular: Palpable pedal pulses bilaterally. Capillary refill within normal limits.  No appreciable edema.  No erythema or calor.  Assessment/Plan of Care: 1. Ulcer of toe of right foot, limited to breakdown of skin (HCC)   2. Pain due to onychomycosis of toenails of both feet      Meds ordered this encounter  Medications   ciclopirox (PENLAC) 8 % solution    Sig: Apply topically at bedtime. Apply over nail and surrounding skin. Apply daily over previous coat. Remove weekly with polish remover.    Dispense:  6.6 mL    Refill:  11   The  left hallux toenail was debrided with nail nippers / sanding burr to decrease length/bulk.  Rx ciclopirox sent to his pharmacy.  Patient may use urea 40 cream on the hallux skin to decrease chance of callus build-up.  Pumice stone is okay to use at home.  F/u 2 months to recheck this toe   Nicci Vaughan DBurna Mortimer, DPM, FACFAS [facility]     [address] [place], [place] [address]                Office [phone]  Fax [phone]

## 2022-10-22 ENCOUNTER — Encounter: Payer: Self-pay | Admitting: Cardiology

## 2022-10-22 ENCOUNTER — Ambulatory Visit: Payer: Medicare HMO | Attending: Cardiology | Admitting: Cardiology

## 2022-10-22 VITALS — BP 109/72 | HR 73 | Ht 75.0 in | Wt 196.0 lb

## 2022-10-22 DIAGNOSIS — Z79899 Other long term (current) drug therapy: Secondary | ICD-10-CM | POA: Diagnosis not present

## 2022-10-22 DIAGNOSIS — I10 Essential (primary) hypertension: Secondary | ICD-10-CM | POA: Diagnosis not present

## 2022-10-22 DIAGNOSIS — Z01812 Encounter for preprocedural laboratory examination: Secondary | ICD-10-CM | POA: Diagnosis not present

## 2022-10-22 DIAGNOSIS — R072 Precordial pain: Secondary | ICD-10-CM | POA: Diagnosis not present

## 2022-10-22 DIAGNOSIS — E785 Hyperlipidemia, unspecified: Secondary | ICD-10-CM | POA: Diagnosis not present

## 2022-10-22 DIAGNOSIS — F17200 Nicotine dependence, unspecified, uncomplicated: Secondary | ICD-10-CM | POA: Diagnosis not present

## 2022-10-22 DIAGNOSIS — R55 Syncope and collapse: Secondary | ICD-10-CM

## 2022-10-22 DIAGNOSIS — I7121 Aneurysm of the ascending aorta, without rupture: Secondary | ICD-10-CM | POA: Diagnosis not present

## 2022-10-22 MED ORDER — METOPROLOL TARTRATE 100 MG PO TABS: 100.0000 mg | ORAL_TABLET | Freq: Once | ORAL | 0 refills | Status: DC

## 2022-10-22 NOTE — Progress Notes (Unsigned)
Cardiology Office Note:  .   Date:  10/22/2022  ID:  NAOKI MIGLIACCIO, DOB 11/19/1955, MRN [id number] PCP: Karilyn Cota, NP  [facility] Providers Cardiologist:  None { Click to update primary MD,subspecialty MD or APP then REFRESH:1}   History of Present Illness: .   CHAKA JEFFERYS is a 67 y.o. male with a past medical history of hypertension, ascending aortic aneurysm 43 mm, GERD, history of TBI, bipolar 2 disorder, dyslipidemia, tobacco use.  04/03/2022 echo mild concentric LVH, EF 55 to 60%, mild MR, mild to moderate AR, trivial TR, visualized portions of the aorta appear to be normal in size.  Was recently evaluated by Dr. Bing Matter on 11/19/2021 following an episode of syncope.  Workup had been unrevealing, discussions were had surrounding ILR however the decision was made not to proceed with this but if he had a future episode of syncope Dr. Bing Matter felt this would be necessary.  He presents today accompanied by his wife for follow-up of his near syncopal episodes and his aortic aneurysm.  He has had 1 episode of near syncope where his wife states he got up fast to walk down the hall, had to prop himself up in the hall feeling dizzy and noted some chest heaviness.  His Zestoretic dose was recently increased approximately 2 months ago by his PCP.  Blood pressure in the office today is on the low normal side at 109/72.  He also endorses a couple of episodes of chest tightness, most bothersome to him when he feels stressed out, his wife states his previous job was very stressful and he would endorse chest tightness throughout the day.  He denies palpitations, dyspnea, pnd, orthopnea, n, v,  syncope, edema, weight gain, or early satiety.    ROS: Review of Systems  Cardiovascular:  Positive for chest pain.  All other systems reviewed and are negative.    Studies Reviewed: .        Cardiac Studies & Procedures     STRESS TESTS  MYOCARDIAL PERFUSION IMAGING 04/03/2022    ECHOCARDIOGRAM  ECHOCARDIOGRAM COMPLETE 06/22/2021  Narrative ECHOCARDIOGRAM REPORT    Patient Name:   HYMEN ARNETT Date of Exam: 06/22/2021 Medical Rec #:  [id number]    Height:       75.0 in Accession #:    [id number]   Weight:       205.0 lb Date of Birth:  [birth year]    BSA:          2.218 m Patient Age:    65 years     BP:           100/60 mmHg Patient Gender: M            HR:           67 bpm. Exam Location:  [facility]  Procedure: 2D Echo, Cardiac Doppler, Color Doppler and Strain Analysis  Indications:    Dyspnea on exertion [R06.09 (ICD-10-CM)]  History:        Patient has no prior history of Echocardiogram examinations. Risk Factors:Hypertension, Dyslipidemia and Tobacco use disorder.  Sonographer:    Louie Boston RDCS Referring Phys: [phone] Marveen Reeks KRASOWSKI  IMPRESSIONS   1. Left ventricular ejection fraction, by estimation, is 55 to 60%. The left ventricle has normal function. The left ventricle has no regional wall motion abnormalities. There is moderate left ventricular hypertrophy. Left ventricular diastolic parameters are consistent with Grade I diastolic dysfunction (impaired relaxation). 2. Right ventricular systolic function  is normal. The right ventricular size is normal. There is normal pulmonary artery systolic pressure. 3. Left atrial size was severely dilated. 4. The mitral valve is normal in structure. Mild mitral valve regurgitation. No evidence of mitral stenosis. 5. The aortic valve was not well visualized. Aortic valve regurgitation is mild. Mild aortic valve stenosis. 6. Aneurysm of the ascending aorta, measuring 43 mm. 7. The inferior vena cava is normal in size with greater than 50% respiratory variability, suggesting right atrial pressure of 3 mmHg.  FINDINGS Left Ventricle: Left ventricular ejection fraction, by estimation, is 55 to 60%. The left ventricle has normal function. The left ventricle has no regional wall motion abnormalities. The left  ventricular internal cavity size was normal in size. There is moderate left ventricular hypertrophy. Left ventricular diastolic parameters are consistent with Grade I diastolic dysfunction (impaired relaxation).  Right Ventricle: The right ventricular size is normal. No increase in right ventricular wall thickness. Right ventricular systolic function is normal. There is normal pulmonary artery systolic pressure. The tricuspid regurgitant velocity is 1.95 m/s, and with an assumed right atrial pressure of 3 mmHg, the estimated right ventricular systolic pressure is 18.2 mmHg.  Left Atrium: Left atrial size was severely dilated.  Right Atrium: Right atrial size was normal in size.  Pericardium: There is no evidence of pericardial effusion.  Mitral Valve: The mitral valve is normal in structure. Mild mitral valve regurgitation. No evidence of mitral valve stenosis.  Tricuspid Valve: The tricuspid valve is normal in structure. Tricuspid valve regurgitation is not demonstrated. No evidence of tricuspid stenosis.  Aortic Valve: The aortic valve was not well visualized. Aortic valve regurgitation is mild. Aortic regurgitation PHT measures 428 msec. Mild aortic stenosis is present. Aortic valve mean gradient measures 13.0 mmHg. Aortic valve peak gradient measures 21.9 mmHg. Aortic valve area, by VTI measures 1.80 cm.  Pulmonic Valve: The pulmonic valve was normal in structure. Pulmonic valve regurgitation is not visualized. No evidence of pulmonic stenosis.  Aorta: The aortic root is normal in size and structure. There is an aneurysm involving the ascending aorta measuring 43 mm.  Venous: The inferior vena cava is normal in size with greater than 50% respiratory variability, suggesting right atrial pressure of 3 mmHg.  IAS/Shunts: No atrial level shunt detected by color flow Doppler.   LEFT VENTRICLE PLAX 2D LVIDd:         5.20 cm      Diastology LVIDs:         3.90 cm      LV e' medial:     4.12 cm/s LV PW:         1.65 cm      LV E/e' medial:  10.1 LV IVS:        1.65 cm      LV e' lateral:   6.25 cm/s LVOT diam:     2.40 cm      LV E/e' lateral: 6.7 LV SV:         93 LV SV Index:   42 LVOT Area:     4.52 cm  LV Volumes (MOD) LV vol d, MOD A2C: 118.0 ml LV vol d, MOD A4C: 137.0 ml LV vol s, MOD A2C: 74.6 ml LV vol s, MOD A4C: 69.8 ml LV SV MOD A2C:     43.4 ml LV SV MOD A4C:     137.0 ml LV SV MOD BP:      56.4 ml  RIGHT VENTRICLE  IVC RV S prime:     10.70 cm/s  IVC diam: 1.50 cm TAPSE (M-mode): 2.3 cm  LEFT ATRIUM             Index        RIGHT ATRIUM           Index LA diam:        3.50 cm 1.58 cm/m   RA Area:     17.80 cm LA Vol (A2C):   96.4 ml 43.47 ml/m  RA Volume:   42.40 ml  19.12 ml/m LA Vol (A4C):   63.0 ml 28.41 ml/m LA Biplane Vol: 79.1 ml 35.67 ml/m AORTIC VALVE AV Area (Vmax):    1.71 cm AV Area (Vmean):   1.63 cm AV Area (VTI):     1.80 cm AV Vmax:           234.00 cm/s AV Vmean:          168.000 cm/s AV VTI:            0.519 m AV Peak Grad:      21.9 mmHg AV Mean Grad:      13.0 mmHg LVOT Vmax:         88.60 cm/s LVOT Vmean:        60.500 cm/s LVOT VTI:          0.206 m LVOT/AV VTI ratio: 0.40 AI PHT:            428 msec  AORTA Ao Root diam: 4.20 cm Ao Asc diam:  4.30 cm Ao Desc diam: 2.10 cm  MITRAL VALVE               TRICUSPID VALVE MV Area (PHT): 2.22 cm    TR Peak grad:   15.2 mmHg MV Decel Time: 342 msec    TR Vmax:        195.00 cm/s MV E velocity: 41.60 cm/s MV A velocity: 62.60 cm/s  SHUNTS MV E/A ratio:  0.66        Systemic VTI:  0.21 m Systemic Diam: 2.40 cm  Belva Crome MD Electronically signed by Belva Crome MD Signature Date/Time: 06/22/2021/4:49:24 PM    Final    MONITORS  LONG TERM MONITOR (3-14 DAYS) 07/06/2021  Narrative Patch Wear Time:  7 days and 21 hours (2023-05-30T15:57:07-398 to 2023-06-11T13:43:23-0400)  Monitor 1 Patient had a min HR of 56 bpm, max HR of 187 bpm,  and avg HR of 81 bpm. Predominant underlying rhythm was Sinus Rhythm. 3 Supraventricular Tachycardia runs occurred, the run with the fastest interval lasting 7 beats with a max rate of 187 bpm, the longest lasting 6 beats with an avg rate of 117 bpm. Isolated SVEs were rare (<1.0%), SVE Couplets were rare (<1.0%), and SVE Triplets were rare (<1.0%). Isolated VEs were rare (<1.0%), and no VE Couplets or VE Triplets were present.  Monitor 2 Patient had a min HR of 51 bpm, max HR of 176 bpm, and avg HR of 78 bpm. Predominant underlying rhythm was Sinus Rhythm. 5 Supraventricular Tachycardia runs occurred, the run with the fastest interval lasting 7 beats with a max rate of 176 bpm (avg 141 bpm); the run with the fastest interval was also the longest. Isolated SVEs were rare (<1.0%), SVE Couplets were rare (<1.0%), and SVE Triplets were rare (<1.0%). Isolated VEs were rare (<1.0%), and no VE Couplets or VE Triplets were present.   7 conclusions: Total of 8 supraventricular tachycardias noted Triggered event showing  sinus rhythm           Risk Assessment/Calculations:             Physical Exam:   VS:  BP 109/72 (BP Location: Left Arm, Patient Position: Sitting, Cuff Size: Normal)   Pulse 73   Ht 6\' 3"  (1.905 m)   Wt 196 lb (88.9 kg)   SpO2 98%   BMI 24.50 kg/m    Wt Readings from Last 3 Encounters:  10/22/22 196 lb (88.9 kg)  11/19/21 189 lb 12.8 oz (86.1 kg)  08/19/21 205 lb 3.2 oz (93.1 kg)    GEN: Well nourished, well developed in no acute distress NECK: No JVD; No carotid bruits CARDIAC: RRR, no murmurs, rubs, gallops RESPIRATORY:  Clear to auscultation without rales, wheezing or rhonchi  ABDOMEN: Soft, non-tender, non-distended EXTREMITIES:  No edema; No deformity   ASSESSMENT AND PLAN: .   Precordial pain-has had episodes of chest pain, has mixed features but it sounds like it could be angina.  He notices it is most bothersome for him when he is very stressed out, does not  necessarily worsen with exertion.  He has comorbid conditions including tobacco abuse, hypertension, dyslipidemia and I think we need to investigate this further with a coronary CTA.  Hypertension-blood pressure in the office today is 109/72, he endorses several episodes of dizziness also had episode of syncope last year.  1 episode earlier this month sounds to be associated with orthostasis.  His Zestoretic was increased approximately 2 months ago by his PCP.  It sounds like he is mostly drinking coffee and Coke throughout the day.  We discussed how this further dehydrate him and the need to increase his water intake and decrease his caffeine consumption.  Will have him check his blood pressure twice daily before we make any changes to his medications.  Ascending aortic aneurysm-at one point was noted to be 43 mm, we do need to maintain excellent blood pressure control, which we currently are however it may be too low for him.  Dyslipidemia-currently monitored by his PCP, do not have access to these records, continue Lipitor 40 mg daily.         Dispo: cCTA at [facility]. CBC, BMET. Follow up depending on results.   Signed, Flossie Dibble, NP

## 2022-10-22 NOTE — Patient Instructions (Signed)
Medication Instructions:  Your physician recommends that you continue on your current medications as directed. Please refer to the Current Medication list given to you today.  *If you need a refill on your cardiac medications before your next appointment, please call your pharmacy*   Lab Work: Your physician recommends that you return for lab work in: Today for BMP and CBC  If you have labs (blood work) drawn today and your tests are completely normal, you will receive your results only by: MyChart Message (if you have MyChart) OR A paper copy in the mail If you have any lab test that is abnormal or we need to change your treatment, we will call you to review the results.   Testing/Procedures: Please arrive at the [facility] main entrance of [facility] at xx:xx AM (30-45 minutes prior to test start time)  [facility] [address] [place], [place] [address] [phone]  Proceed to the [facility] Radiology Department (First Floor).  Please follow these instructions carefully (unless otherwise directed):  Hold all erectile dysfunction medications at least 48 hours prior to test.  On the Night Before the Test: Drink plenty of water. Do not consume any caffeinated/decaffeinated beverages or chocolate 12 hours prior to your test. Do not take any antihistamines 12 hours prior to your test. On the Day of the Test: Drink plenty of water. Do not drink any water within one hour of the test. Do not eat any food 4 hours prior to the test. You may take your regular medications prior to the test. IF NOT ON A BETA BLOCKER - Take 100 mg of lopressor (metoprolol) 2 hours before the test. After the Test: Drink plenty of water. After receiving IV contrast, you may experience a mild flushed feeling. This is normal. On occasion, you may experience a mild rash up to 24 hours after the test. This is not dangerous. If this occurs, you can take Benadryl 25 mg and increase  your fluid intake. If you experience trouble breathing, this can be serious. If it is severe call 911 IMMEDIATELY. If it is mild, please call our office.   Follow-Up: At [facility], you and your health needs are our priority.  As part of our continuing mission to provide you with exceptional heart care, we have created designated Provider Care Teams.  These Care Teams include your primary Cardiologist (physician) and Advanced Practice Providers (APPs -  Physician Assistants and Nurse Practitioners) who all work together to provide you with the care you need, when you need it.  We recommend signing up for the patient portal called "MyChart".  Sign up information is provided on this After Visit Summary.  MyChart is used to connect with patients for Virtual Visits (Telemedicine).  Patients are able to view lab/test results, encounter notes, upcoming appointments, etc.  Non-urgent messages can be sent to your provider as well.   To learn more about what you can do with MyChart, go to [email or web address].    Your next appointment:  To be determined    Provider:   Gypsy Balsam, MD    Other Instructions Check and record BP for 2 weeks and bring in log to our office

## 2022-10-23 LAB — CBC WITH DIFFERENTIAL/PLATELET
Basophils Absolute: 0.1 x10E3/uL (ref 0.0–0.2)
Basos: 1 %
EOS (ABSOLUTE): 0.2 x10E3/uL (ref 0.0–0.4)
Eos: 2 %
Hematocrit: 41.1 % (ref 37.5–51.0)
Hemoglobin: 13.5 g/dL (ref 13.0–17.7)
Immature Grans (Abs): 0 x10E3/uL (ref 0.0–0.1)
Immature Granulocytes: 0 %
Lymphocytes Absolute: 3.1 x10E3/uL (ref 0.7–3.1)
Lymphs: 33 %
MCH: 29.2 pg (ref 26.6–33.0)
MCHC: 32.8 g/dL (ref 31.5–35.7)
MCV: 89 fL (ref 79–97)
Monocytes Absolute: 0.7 x10E3/uL (ref 0.1–0.9)
Monocytes: 7 %
Neutrophils Absolute: 5.3 x10E3/uL (ref 1.4–7.0)
Neutrophils: 57 %
Platelets: 335 x10E3/uL (ref 150–450)
RBC: 4.62 x10E6/uL (ref 4.14–5.80)
RDW: 12.7 % (ref 11.6–15.4)
WBC: 9.4 x10E3/uL (ref 3.4–10.8)

## 2022-10-23 LAB — BASIC METABOLIC PANEL WITH GFR
BUN/Creatinine Ratio: 17 (ref 10–24)
BUN: 26 mg/dL (ref 8–27)
CO2: 22 mmol/L (ref 20–29)
Calcium: 9.3 mg/dL (ref 8.6–10.2)
Chloride: 104 mmol/L (ref 96–106)
Creatinine, Ser: 1.5 mg/dL — ABNORMAL HIGH (ref 0.76–1.27)
Glucose: 89 mg/dL (ref 70–99)
Potassium: 4.8 mmol/L (ref 3.5–5.2)
Sodium: 140 mmol/L (ref 134–144)
eGFR: 50 mL/min/1.73 — ABNORMAL LOW

## 2022-10-25 ENCOUNTER — Telehealth: Payer: Self-pay | Admitting: *Deleted

## 2022-10-25 DIAGNOSIS — R899 Unspecified abnormal finding in specimens from other organs, systems and tissues: Secondary | ICD-10-CM

## 2022-10-25 NOTE — Telephone Encounter (Signed)
-----   Message from Flossie Dibble sent at 10/24/2022 12:13 PM EDT ----- Kidney function is slightly worse. I think his intake of excessive caffeine is dehydrating him. Try to cut out half of his coffee and coke consumption and replace with water.   Return in 2 weeks for repeat BMET after making these changes.

## 2022-10-25 NOTE — Telephone Encounter (Signed)
Informed pt of lab results. Pt verbalized understanding and had no further questions.

## 2022-11-03 ENCOUNTER — Telehealth (HOSPITAL_COMMUNITY): Payer: Self-pay | Admitting: *Deleted

## 2022-11-03 NOTE — Telephone Encounter (Signed)
Reaching out to patient to offer assistance regarding upcoming cardiac imaging study; pt verbalizes understanding of appt date/time, parking situation and where to check in, pre-test NPO status and medications ordered, and verified current allergies; name and call back number provided for further questions should they arise Hayley Sharpe RN Navigator Cardiac [facility] [phone] office [phone] cell  

## 2022-11-04 ENCOUNTER — Ambulatory Visit (HOSPITAL_COMMUNITY)
Admission: RE | Admit: 2022-11-04 | Discharge: 2022-11-04 | Disposition: A | Discharge status: Home / Self Care | Payer: Medicare HMO | Source: Ambulatory Visit | Attending: Cardiology | Admitting: Cardiology

## 2022-11-04 DIAGNOSIS — R072 Precordial pain: Secondary | ICD-10-CM

## 2022-11-04 MED ORDER — NITROGLYCERIN 0.4 MG SL SUBL
0.8000 mg | SUBLINGUAL_TABLET | Freq: Once | SUBLINGUAL | Status: AC
  Administered 2022-11-04: 0.8 mg via SUBLINGUAL

## 2022-11-04 MED ORDER — IOHEXOL 350 MG/ML SOLN
95.0000 mL | Freq: Once | INTRAVENOUS | Status: AC | PRN
  Administered 2022-11-04: 95 mL via INTRAVENOUS

## 2022-11-04 MED ORDER — NITROGLYCERIN 0.4 MG SL SUBL
SUBLINGUAL_TABLET | SUBLINGUAL | Status: AC
  Filled 2022-11-04: qty 2

## 2022-11-05 ENCOUNTER — Other Ambulatory Visit: Payer: Self-pay

## 2022-11-05 DIAGNOSIS — E785 Hyperlipidemia, unspecified: Secondary | ICD-10-CM

## 2022-11-05 MED ORDER — ASPIRIN 81 MG PO TBEC: 81.0000 mg | DELAYED_RELEASE_TABLET | Freq: Every day | ORAL | Status: AC

## 2022-11-08 DIAGNOSIS — E785 Hyperlipidemia, unspecified: Secondary | ICD-10-CM | POA: Diagnosis not present

## 2022-11-09 ENCOUNTER — Telehealth: Payer: Self-pay

## 2022-11-09 DIAGNOSIS — E785 Hyperlipidemia, unspecified: Secondary | ICD-10-CM

## 2022-11-09 LAB — COMPREHENSIVE METABOLIC PANEL WITH GFR
ALT: 9 IU/L (ref 0–44)
AST: 11 IU/L (ref 0–40)
Albumin: 4.4 g/dL (ref 3.9–4.9)
Alkaline Phosphatase: 117 IU/L (ref 44–121)
BUN/Creatinine Ratio: 18 (ref 10–24)
BUN: 21 mg/dL (ref 8–27)
Bilirubin Total: 0.5 mg/dL (ref 0.0–1.2)
CO2: 22 mmol/L (ref 20–29)
Calcium: 9 mg/dL (ref 8.6–10.2)
Chloride: 103 mmol/L (ref 96–106)
Creatinine, Ser: 1.14 mg/dL (ref 0.76–1.27)
Globulin, Total: 2.5 g/dL (ref 1.5–4.5)
Glucose: 102 mg/dL — ABNORMAL HIGH (ref 70–99)
Potassium: 4.2 mmol/L (ref 3.5–5.2)
Sodium: 141 mmol/L (ref 134–144)
Total Protein: 6.9 g/dL (ref 6.0–8.5)
eGFR: 70 mL/min/1.73

## 2022-11-09 LAB — LIPID PANEL
Chol/HDL Ratio: 4.1 ratio (ref 0.0–5.0)
Cholesterol, Total: 203 mg/dL — ABNORMAL HIGH (ref 100–199)
HDL: 49 mg/dL
LDL Chol Calc (NIH): 130 mg/dL — ABNORMAL HIGH (ref 0–99)
Triglycerides: 133 mg/dL (ref 0–149)
VLDL Cholesterol Cal: 24 mg/dL (ref 5–40)

## 2022-11-09 MED ORDER — ATORVASTATIN CALCIUM 80 MG PO TABS: 80.0000 mg | ORAL_TABLET | Freq: Every day | ORAL | 3 refills | Status: AC

## 2022-11-09 NOTE — Telephone Encounter (Signed)
Results reviewed with Aaron Jimenez per DPR as per Wallis Bamberg PA's note. Aaron Jimenez verbalized understanding and had no additional questions. Routed to PCP.

## 2022-11-09 NOTE — Telephone Encounter (Signed)
-----   Message from Flossie Dibble sent at 11/09/2022  7:57 AM EDT ----- Kidney function looks better!  Cholesterol is too high now that we know he has some disease in his coronary arteries.  Increase Lipitor to 80 mg daily. Repeat FLP and LFTs in 8 weeks.

## 2022-12-22 ENCOUNTER — Ambulatory Visit: Payer: Medicare HMO | Admitting: Podiatry

## 2022-12-22 DIAGNOSIS — L97511 Non-pressure chronic ulcer of other part of right foot limited to breakdown of skin: Secondary | ICD-10-CM | POA: Diagnosis not present

## 2022-12-22 DIAGNOSIS — F3181 Bipolar II disorder: Secondary | ICD-10-CM | POA: Diagnosis not present

## 2022-12-22 DIAGNOSIS — Z89421 Acquired absence of other right toe(s): Secondary | ICD-10-CM | POA: Diagnosis not present

## 2022-12-22 NOTE — Progress Notes (Signed)
   Chief Complaint  Patient presents with   Wound Check    Non Diabetic, denies drainage or pain, denies N/V/F/C/SOB   HPI: 67 y.o. male presents today for recheck of his right hallux wound.  The patient just retired from work so he has been off of his feet more.  He notes no pain or drainage from the area and feels that everything is resolved.  A family member is with him today.  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[facility])     Past Surgical History:  Procedure Laterality Date   ANTERIOR CERVICAL DECOMP/DISCECTOMY FUSION N/A 07/27/2012   Procedure: ANTERIOR CERVICAL DECOMPRESSION/DISCECTOMY FUSION CERVICALSIX-SEVEN1 LEVEL/HARDWARE REMOVAL CERVICAL FIVE-SIX;  Surgeon: Tia Alert, MD;  Location: [facility];  Service: Neurosurgery;  Laterality: N/A;  Anterior Cervical Decompression/Discectomy Fusion, Cervical Six-Seven Fusion, Removal of hardware at Cervical Five-Six   BACK SURGERY     neck fusion   KNEE ARTHROSCOPY     Left testicle removed  Left    NOSE SURGERY     2020   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  Minimal hyperkeratosis noted on the plantar aspect of the right hallux.  No open lesions noted.  No fluctuance noted.  No erythema is noted.  There is no pain on palpation of the area.  There is a palpable bony prominence on the lateral aspect of the fifth metatarsal base, but there is no pain on palpation.   Assessment/Plan of Care: 1. Ulcer of toe of right foot, limited to breakdown of skin [facility])     Patient informed that the ulceration appears healed at this time.  The minimal  hyperkeratosis was shaved with a sterile #[id number] blade.  Discussed wide shoe gear to accommodate the prominent fifth metatarsal base and lateral aspect of the right foot.  Follow-up as needed   Clerance Lav, DPM, FACFAS [facility]     [address] [place], [place] [address]                Office [phone]  Fax [phone]

## 2023-01-07 DIAGNOSIS — Z23 Encounter for immunization: Secondary | ICD-10-CM | POA: Diagnosis not present

## 2023-04-26 DIAGNOSIS — R110 Nausea: Secondary | ICD-10-CM | POA: Diagnosis not present

## 2023-04-26 DIAGNOSIS — I10 Essential (primary) hypertension: Secondary | ICD-10-CM | POA: Diagnosis not present

## 2023-04-26 DIAGNOSIS — R42 Dizziness and giddiness: Secondary | ICD-10-CM | POA: Diagnosis not present

## 2023-05-02 ENCOUNTER — Encounter: Payer: Self-pay | Admitting: Cardiology

## 2023-05-02 ENCOUNTER — Ambulatory Visit: Attending: Cardiology | Admitting: Cardiology

## 2023-05-02 VITALS — BP 138/78 | HR 61 | Ht 75.0 in | Wt 213.8 lb

## 2023-05-02 DIAGNOSIS — I7121 Aneurysm of the ascending aorta, without rupture: Secondary | ICD-10-CM | POA: Diagnosis not present

## 2023-05-02 DIAGNOSIS — I10 Essential (primary) hypertension: Secondary | ICD-10-CM | POA: Diagnosis not present

## 2023-05-02 DIAGNOSIS — E785 Hyperlipidemia, unspecified: Secondary | ICD-10-CM

## 2023-05-02 DIAGNOSIS — I2510 Atherosclerotic heart disease of native coronary artery without angina pectoris: Secondary | ICD-10-CM

## 2023-05-02 DIAGNOSIS — M79606 Pain in leg, unspecified: Secondary | ICD-10-CM | POA: Diagnosis not present

## 2023-05-02 HISTORY — DX: Atherosclerotic heart disease of native coronary artery without angina pectoris: I25.10

## 2023-05-02 MED ORDER — EZETIMIBE 10 MG PO TABS: 10.0000 mg | ORAL_TABLET | Freq: Every day | ORAL | 3 refills | Status: AC

## 2023-05-02 NOTE — Patient Instructions (Signed)
 Medication Instructions:    START: Zetia 10mg  1 tablet daily   Lab Work: Your physician recommends that you return for lab work in: 6 weeks You need to have labs done when you are fasting.  You can come Monday through Friday 8:30 am to 12:00 pm and 1:15 to 4:30. You do not need to make an appointment as the order has already been placed. The labs you are going to have done are AST, ALT and Lipids.    Testing/Procedures: Your physician has requested that you have a lower extremity arterial duplex. This test is an ultrasound of the arteries in the legs or arms. It looks at arterial blood flow in the legs and arms. Allow one hour for Lower and Upper Arterial scans. There are no restrictions or special instructions.  Please note: We ask at that you not bring children with you during ultrasound (echo/ vascular) testing. Due to room size and safety concerns, children are not allowed in the ultrasound rooms during exams. Our front office staff cannot provide observation of children in our lobby area while testing is being conducted. An adult accompanying a patient to their appointment will only be allowed in the ultrasound room at the discretion of the ultrasound technician under special circumstances. We apologize for any inconvenience.    Follow-Up: At [facility], you and your health needs are our priority.  As part of our continuing mission to provide you with exceptional heart care, we have created designated Provider Care Teams.  These Care Teams include your primary Cardiologist (physician) and Advanced Practice Providers (APPs -  Physician Assistants and Nurse Practitioners) who all work together to provide you with the care you need, when you need it.  We recommend signing up for the patient portal called "MyChart".  Sign up information is provided on this After Visit Summary.  MyChart is used to connect with patients for Virtual Visits (Telemedicine).  Patients are able to view lab/test  results, encounter notes, upcoming appointments, etc.  Non-urgent messages can be sent to your provider as well.   To learn more about what you can do with MyChart, go to [email or web address].    Your next appointment:   6 month(s)  The format for your next appointment:   In Person  Provider:   Ralene Burger, MD    Other Instructions NA

## 2023-05-02 NOTE — Progress Notes (Signed)
 Cardiology Office Note:    Date:  05/02/2023   ID:  Aaron Jimenez, DOB 1970-11-18, MRN [id number]  PCP:  Jenita Miyamoto, NP  Cardiologist:  Ralene Burger, MD    Referring MD: Jenita Miyamoto, NP   Chief Complaint  Patient presents with   Dizziness    History of Present Illness:    Aaron Jimenez is a 68 y.o. male past medical history significant for coronary artery disease recently had coronary CT angio which showed mild disease.  Also ascending aortic aneurysm last measurement 41 to 42 mm, GERD, bipolar disorder, dyslipidemia, history of tobacco abuse.  Comes today twice for follow-up additional problem was dizziness recently his medication has been adjusted and his dizziness went away.  Lisinopril hydrochlorothiazide was decreased.  He is doing fine from that aspect however he complained of having pain in his lower extremities while walking.  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[facility])     Past Surgical History:  Procedure Laterality Date   ANTERIOR CERVICAL DECOMP/DISCECTOMY FUSION N/A 07/27/2012   Procedure: ANTERIOR CERVICAL DECOMPRESSION/DISCECTOMY FUSION CERVICALSIX-SEVEN1 LEVEL/HARDWARE REMOVAL CERVICAL FIVE-SIX;  Surgeon: Isadora Mar, MD;  Location: [facility];  Service: Neurosurgery;  Laterality: N/A;  Anterior Cervical Decompression/Discectomy Fusion, Cervical Six-Seven Fusion, Removal of hardware at Cervical Five-Six   BACK SURGERY     neck fusion   KNEE ARTHROSCOPY     Left testicle removed  Left    NOSE SURGERY     2020   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 Swallow whole.   atorvastatin (LIPITOR) 80 MG tablet Take 1 tablet (80 mg total) by mouth daily.   ciclopirox (PENLAC) 8 % solution Apply topically at bedtime. Apply over nail and surrounding skin. Apply daily over previous coat. Remove weekly with polish remover. (Patient taking differently: Apply 1 Application topically at bedtime. Apply over nail and surrounding skin. Apply daily over previous coat. Remove weekly with polish remover.)   EPINEPHrine 0.3 mg/0.3 mL IJ SOAJ injection Inject 0.3 mg into the muscle as needed for anaphylaxis.   ezetimibe (ZETIA) 10 MG tablet Take 1 tablet (10 mg total) by mouth daily.   FLUoxetine (PROZAC) 20 MG capsule Take 20 mg by mouth daily.   ibuprofen (ADVIL) 800 MG tablet Take 800 mg by mouth 3 (three) times daily.   lisinopril-hydrochlorothiazide (ZESTORETIC) 20-12.5 MG tablet Take 1 tablet by mouth daily.   meclizine (ANTIVERT) 25 MG tablet Take 25 mg by mouth as needed for nausea.   omeprazole (PRILOSEC) 40 MG capsule Take 40 mg by mouth daily. Take 1-2 capsules a day.   rOPINIRole (REQUIP XL) 2 MG 24 hr tablet Take 2 mg by mouth daily.   sertraline (ZOLOFT) 100 MG tablet Take 100 mg by mouth daily.   traMADol (ULTRAM) 50 MG tablet Take 50 mg by mouth 4 (four) times daily as needed for pain.   [DISCONTINUED] metoprolol tartrate (LOPRESSOR) 100 MG tablet Take 1 tablet (100 mg total) by  mouth once for 1 dose. Take 2 hours prior to CT     Allergies:   Bee venom, Mixed ragweed, and Molds & smuts   Social History   Socioeconomic History   Marital status: Married    Spouse name: Not on file   Number of children: Not on file   Years of education: Not on file   Highest education level: Not on file  Occupational History   Not on file  Tobacco Use   Smoking status: Former   Smokeless tobacco: Not on file  Vaping Use   Vaping status: Never Used  Substance and Sexual Activity   Alcohol use: Yes    Comment:  rare   Drug use: No   Sexual activity: Yes  Other Topics Concern   Not on file  Social History Narrative   Left handed    Lives with wife one story home   Caffeine a pot a day or more.   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 ROS:   Please see the history of present illness.    All 14 point review of systems negative except as described per history of present illness  EKGs/Labs/Other Studies Reviewed:         Recent Labs: 10/22/2022: Hemoglobin 13.5; Platelets 335 11/08/2022: ALT 9; BUN 21; Creatinine, Ser 1.14; Potassium 4.2; Sodium 141  Recent Lipid Panel    Component Value Date/Time   CHOL 203 (H) 11/08/2022 0930   TRIG 133 11/08/2022 0930   HDL 49 11/08/2022 0930   CHOLHDL 4.1 11/08/2022 0930   LDLCALC 130 (H) 11/08/2022 0930    Physical Exam:    VS:  BP 138/78 (BP Location: Left Arm, Patient Position: Sitting)   Pulse 61   Ht 6\' 3"  (1.905 m)   Wt 213 lb 12.8 oz (97 kg)   SpO2 96%   BMI 26.72 kg/m     Wt Readings from Last 3 Encounters:  05/02/23 213 lb 12.8 oz (97 kg)  10/22/22 196 lb (88.9 kg)  11/19/21 189 lb 12.8 oz (86.1 kg)     GEN:  Well nourished, well developed in no acute distress HEENT: Normal NECK: No JVD; No carotid bruits LYMPHATICS: No lymphadenopathy CARDIAC: RRR, no murmurs, no rubs, no gallops RESPIRATORY:  Clear to auscultation without rales, wheezing or rhonchi  ABDOMEN: Soft, non-tender, non-distended MUSCULOSKELETAL:  No edema; No deformity  SKIN: Warm and  dry LOWER EXTREMITIES: no swelling NEUROLOGIC:  Alert and oriented x 3 PSYCHIATRIC:  Normal affect   ASSESSMENT:    1. Essential hypertension   2. Dyslipidemia   3. Pain of lower extremity, unspecified laterality   4. Coronary artery disease involving native coronary artery of native heart without angina pectoris   5. Aneurysm of ascending aorta without rupture (HCC)    PLAN:    In order of problems listed above:  Essential hypertension blood pressure well-controlled continue present management.  Will be careful with medications since he does have orthostatic hypotension. Dyslipidemia  I did review K PN which show me his LDL of 130 HDL 49 he is on high intensity statin will add Zetia to help decrease cholesterol. Pain lower extremity symptoms have strong suspicion for claudication.  Will schedule him to have arterial duplex evaluation of lower extremities. Ascending arctic aneurysm unchanged continue monitoring   Medication Adjustments/Labs and Tests Ordered: Current medicines are reviewed at length with the patient today.  Concerns regarding medicines are outlined above.  Orders Placed This Encounter  Procedures   ALT   AST   Lipid panel   EKG 12-Lead   VAS US  LOWER EXTREMITY ARTERIAL DUPLEX   VAS US  ABI WITH/WO TBI   Medication changes:  Meds ordered this encounter  Medications   ezetimibe (ZETIA) 10 MG tablet    Sig: Take 1 tablet (10 mg total) by mouth daily.    Dispense:  90 tablet    Refill:  3    Signed, Manfred Seed, MD, [facility] 05/02/2023 4:50 PM    [facility]

## 2023-05-30 ENCOUNTER — Ambulatory Visit: Attending: Cardiology

## 2023-05-30 DIAGNOSIS — M79604 Pain in right leg: Secondary | ICD-10-CM | POA: Diagnosis not present

## 2023-05-30 DIAGNOSIS — M79605 Pain in left leg: Secondary | ICD-10-CM

## 2023-05-30 DIAGNOSIS — M79606 Pain in leg, unspecified: Secondary | ICD-10-CM

## 2023-05-31 LAB — VAS US ABI WITH/WO TBI
Left ABI: 0.71
Right ABI: 0.63

## 2023-06-02 ENCOUNTER — Telehealth: Payer: Self-pay | Admitting: Cardiology

## 2023-06-02 NOTE — Telephone Encounter (Signed)
 Wife (Delilah) called to follow-up on patient's test results and next steps.

## 2023-06-02 NOTE — Telephone Encounter (Signed)
 Spoke with [place] per DPR who states they were concerned waiting on the results of the vascular study as he had to add on an additional test.

## 2023-06-05 ENCOUNTER — Ambulatory Visit: Payer: Self-pay | Admitting: Cardiology

## 2023-06-05 DIAGNOSIS — M79606 Pain in leg, unspecified: Secondary | ICD-10-CM

## 2023-06-08 NOTE — Telephone Encounter (Signed)
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 Lauro Portal

## 2023-06-08 NOTE — Telephone Encounter (Signed)
 Wife (Delilah) called to follow-up patient's test results.

## 2023-06-08 NOTE — Telephone Encounter (Signed)
 Spoke with Aaron Jimenez, notified of results and recommendations and agreed with plan. Referral sent.

## 2023-07-20 ENCOUNTER — Encounter: Payer: Self-pay | Admitting: Cardiovascular Disease

## 2023-07-20 ENCOUNTER — Ambulatory Visit: Attending: Cardiology | Admitting: Cardiovascular Disease

## 2023-07-20 VITALS — BP 140/78 | HR 59 | Resp 16 | Ht 75.0 in | Wt 216.6 lb

## 2023-07-20 DIAGNOSIS — I739 Peripheral vascular disease, unspecified: Secondary | ICD-10-CM

## 2023-07-20 HISTORY — DX: Peripheral vascular disease, unspecified: I73.9

## 2023-07-20 MED ORDER — CILOSTAZOL 50 MG PO TABS: 50.0000 mg | ORAL_TABLET | Freq: Two times a day (BID) | ORAL | 3 refills | Status: DC

## 2023-07-20 NOTE — Patient Instructions (Signed)
 Medication Instructions:  Please start Pletal 50 mg one tablet twice a day. Continue all other medications as listed.  *If you need a refill on your cardiac medications before your next appointment, please call your pharmacy*  Follow-Up: At [facility], you and your health needs are our priority.  As part of our continuing mission to provide you with exceptional heart care, our providers are all part of one team.  This team includes your primary Cardiologist (physician) and Advanced Practice Providers or APPs (Physician Assistants and Nurse Practitioners) who all work together to provide you with the care you need, when you need it.  Your next appointment:   3 month(s)  Provider:   Dr Court     We recommend signing up for the patient portal called MyChart.  Sign up information is provided on this After Visit Summary.  MyChart is used to connect with patients for Virtual Visits (Telemedicine).  Patients are able to view lab/test results, encounter notes, upcoming appointments, etc.  Non-urgent messages can be sent to your provider as well.   To learn more about what you can do with MyChart, go to [email or web address].

## 2023-07-20 NOTE — Progress Notes (Signed)
 07/20/2023 Aaron Jimenez [facility]   1955/11/13  [id number]  Primary Physician Aaron Harlene SAILOR, NP Primary Cardiologist: Aaron Jimenez Aaron Jimenez, FSCAI  HPI:  Aaron Jimenez is a 68 y.o. 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  He is accompanied by his wife Aaron Jimenez today.  He was referred by Dr. Medford Jimenez, his primary cardiologist, for evaluation of claudication.  His risk factors include over 50-pack-year tobacco abuse having quit 10 years ago.  He has treated hypertension and hyperlipidemia.  He has never had a heart attack or stroke.  He denies chest pain but does get some dyspnea on exertion.  He and his wife unfortunately are not very active because of his claudication.  He noticed onset of claudication 3 to 4 months ago which is lifestyle limiting, left greater than right.  He did have Doppler studies performed 05/30/2023 revealing right ABI of 0.63, left of 0.71 with what appears to be a high-frequency signal in his distal right common femoral artery and distal right SFA, and principally tibial disease on the left.   Current Meds  Medication Sig   aspirin  EC 81 MG tablet Take 1 tablet (81 mg total) by mouth daily. Swallow whole.   atorvastatin  (LIPITOR) 80 MG tablet Take 1 tablet (80 mg total) by mouth daily.   ciclopirox  (PENLAC ) 8 % solution Apply topically at bedtime. Apply over nail and surrounding skin. Apply daily over previous coat. Remove weekly with polish remover. (Patient taking differently: Apply 1 Application topically at bedtime. Apply over nail and surrounding skin. Apply daily over previous coat. Remove weekly with polish remover.)   EPINEPHrine 0.3 mg/0.3 mL IJ SOAJ injection Inject 0.3 mg into the muscle as needed for anaphylaxis.   ezetimibe  (ZETIA ) 10 MG tablet Take 1 tablet (10 mg total) by mouth daily.   FLUoxetine  (PROZAC ) 20 MG capsule Take 20 mg by  mouth daily.   ibuprofen (ADVIL) 800 MG tablet Take 800 mg by mouth 3 (three) times daily.   lisinopril-hydrochlorothiazide (ZESTORETIC) 20-12.5 MG tablet Take 1 tablet by mouth daily.   meclizine (ANTIVERT) 25 MG tablet Take 25 mg by mouth as needed for nausea.   omeprazole (PRILOSEC) 40 MG capsule Take 40 mg by mouth daily. Take 1-2 capsules a day.   rOPINIRole (REQUIP XL) 2 MG 24 hr tablet Take 2 mg by mouth daily.   sertraline  (ZOLOFT ) 100 MG tablet Take 100 mg by mouth daily.   traMADol (ULTRAM) 50 MG tablet Take 50 mg by mouth 4 (four) times daily as needed for pain.     Allergies  Allergen Reactions   Bee Venom Anaphylaxis   Mixed Ragweed Shortness Of Breath   Molds & Smuts Other (See Comments)    Wheezing    Social History   Socioeconomic History   Marital status: Married    Spouse name: Not on file   Number of children: Not on file   Years of education: Not on file   Highest education level: Not on file  Occupational History   Not on file  Tobacco Use   Smoking status: Former   Smokeless tobacco: Not on file  Vaping Use   Vaping status: Never Used  Substance and Sexual Activity   Alcohol use: Yes    Comment: rare   Drug use: No   Sexual activity: Yes  Other Topics Concern  Not on file  Social History Narrative   Left handed    Lives with wife one story home   Caffeine a pot a day or more.   Welder - pipe fits ( works)   Social Drivers of Corporate investment banker Strain: Not on [employer] Insecurity: Not on file  Transportation Needs: Not on file  Physical Activity: Not on file  Stress: Not on file  Social Connections: Unknown (05/31/2021)   Received from [facility]   Social Network    Social Network: Not on file  Intimate Partner Violence: Unknown (04/22/2021)   Received from [facility]   HITS    Physically Hurt: Not on file    Insult or Talk Down To: Not on file    Threaten Physical Harm: Not on file    Scream or Curse: Not on file      Review of Systems: General: negative for chills, fever, night sweats or weight changes.  Cardiovascular: negative for chest pain, dyspnea on exertion, edema, orthopnea, palpitations, paroxysmal nocturnal dyspnea or shortness of breath Dermatological: negative for rash Respiratory: negative for cough or wheezing Urologic: negative for hematuria Abdominal: negative for nausea, vomiting, diarrhea, bright red blood per rectum, melena, or hematemesis Neurologic: negative for visual changes, syncope, or dizziness All other systems reviewed and are otherwise negative except as noted above.    Blood pressure (!) 140/78, pulse (!) 59, resp. rate 16, height 6' 3 (1.905 m), weight 216 lb 9.6 oz (98.2 kg), SpO2 92%.  General appearance: alert and no distress Neck: no adenopathy, no carotid bruit, no JVD, supple, symmetrical, trachea midline, and thyroid not enlarged, symmetric, no tenderness/mass/nodules Lungs: clear to auscultation bilaterally Heart: regular rate and rhythm, S1, S2 normal, no murmur, click, rub or gallop Extremities: extremities normal, atraumatic, no cyanosis or edema Pulses: Diminished pedal pulses Skin: Skin color, texture, turgor normal. No rashes or lesions Neurologic: Grossly normal  EKG not performed today      ASSESSMENT AND PLAN:   Peripheral arterial disease (HCC) Mr.Jimenez was referred to me by Aaron Jimenez for evaluation of symptomatic PAD.  He has multiple cardiovascular risk factors.  He has noticed the onset of claudication 3 to 4 months ago which is lifestyle limiting, left greater than right.  He had Doppler studies performed 05/30/2023 revealing right ABI of 0.63 and a left of 0.71.  He had a high-frequency signal in the distal right common femoral artery and distal right SFA.  He appeared to have only tibial vessel disease on the left.  I am going to begin him on Pletal 50 mg p.o. twice daily empirically to see if this improves his claudication symptoms and  will see him back in 3 months.  If he has had no improvement we will discuss an invasive approach/peripheral angiography and endovascular therapy.     Aaron DOROTHA Lesches Jimenez FACP,FACC,FAHA, [facility] 07/20/2023 12:02 PM

## 2023-07-20 NOTE — Assessment & Plan Note (Signed)
 Aaron Jimenez was referred to me by Dr. Krasowski for evaluation of symptomatic PAD.  He has multiple cardiovascular risk factors.  He has noticed the onset of claudication 3 to 4 months ago which is lifestyle limiting, left greater than right.  He had Doppler studies performed 05/30/2023 revealing right ABI of 0.63 and a left of 0.71.  He had a high-frequency signal in the distal right common femoral artery and distal right SFA.  He appeared to have only tibial vessel disease on the left.  I am going to begin him on Pletal 50 mg p.o. twice daily empirically to see if this improves his claudication symptoms and will see him back in 3 months.  If he has had no improvement we will discuss an invasive approach/peripheral angiography and endovascular therapy.

## 2023-07-26 ENCOUNTER — Telehealth: Payer: Self-pay | Admitting: Cardiovascular Disease

## 2023-07-26 NOTE — Telephone Encounter (Signed)
 Pt c/o medication issue:  1. Name of Medication:   cilostazol  (PLETAL ) 50 MG tablet    2. How are you currently taking this medication (dosage and times per day)? As written  3. Are you having a reaction (difficulty breathing--STAT)? No   4. What is your medication issue? Dizziness, forgets, can't focus, falling, stumbling, wants to sleep all the the time. All this started  after starting this medication on 7/2.

## 2023-07-26 NOTE — Telephone Encounter (Signed)
 Spoke with Aaron Jimenez and informed him of Dr. Ranee recommendation to stop the Pletal . Instructed her to call if symptoms do not improve being off of the medication. Aaron Jimenez verbalized understanding.  Aaron Jimenez asked what will be the next steps now. She was not sure about waiting until appt with Dr. Court in October. Will forward to Dr. Ranee nurse to follow-up with patient and spouse if sooner appt is needed.

## 2023-07-26 NOTE — Telephone Encounter (Signed)
 Patient's wife Aaron Jimenez reports patient has been experiencing dizziness, falling/stumbling, wanting to sleep all the time, forgetfulness and unfocused since 07/22/23.  He started Pletal  on 07/20/23. Wife relates symptoms to starting this medication. Patient feels dizzy and is falling even when sitting.  Aaron Jimenez would like to know if Pletal  dose can be adjusted or if there is another medication patient can take. Will forward to Dr. Court to review and advise.

## 2023-07-29 NOTE — Telephone Encounter (Signed)
 Spoke with pt's wife Delilah (ok per DPR) regarding discontinuing of Pletal . Pt does feel better after stopping Pletal . Sooner appointment made for pt to see Dr. Court. Wife verbalizes understanding.

## 2023-08-02 ENCOUNTER — Ambulatory Visit: Attending: Cardiology | Admitting: Cardiovascular Disease

## 2023-08-02 ENCOUNTER — Encounter: Payer: Self-pay | Admitting: Cardiovascular Disease

## 2023-08-02 VITALS — BP 140/64 | HR 60 | Ht 75.0 in | Wt 218.6 lb

## 2023-08-02 DIAGNOSIS — Z01810 Encounter for preprocedural cardiovascular examination: Secondary | ICD-10-CM | POA: Diagnosis not present

## 2023-08-02 DIAGNOSIS — I739 Peripheral vascular disease, unspecified: Secondary | ICD-10-CM | POA: Diagnosis not present

## 2023-08-02 NOTE — Patient Instructions (Signed)
 Medication Instructions:  Your physician recommends that you continue on your current medications as directed. Please refer to the Current Medication list given to you today.  *If you need a refill on your cardiac medications before your next appointment, please call your pharmacy*  Lab Work: Your physician recommends that you have labs drawn today: BMET & CBC  If you have labs (blood work) drawn today and your tests are completely normal, you will receive your results only by: MyChart Message (if you have MyChart) OR A paper copy in the mail If you have any lab test that is abnormal or we need to change your treatment, we will call you to review the results.  Testing/Procedures: Your physician has requested that you have a lower extremity arterial duplex. During this test, ultrasound is used to evaluate arterial blood flow in the legs. Allow one hour for this exam. There are no restrictions or special instructions. This will take place at [address] 4th floor **To be done 1-2 weeks after your procedure (8/14)**  Please note: We ask at that you not bring children with you during ultrasound (echo/ vascular) testing. Due to room size and safety concerns, children are not allowed in the ultrasound rooms during exams. Our front office staff cannot provide observation of children in our lobby area while testing is being conducted. An adult accompanying a patient to their appointment will only be allowed in the ultrasound room at the discretion of the ultrasound technician under special circumstances. We apologize for any inconvenience.   Your physician has requested that you have an ankle brachial index (ABI). During this test an ultrasound and blood pressure cuff are used to evaluate the arteries that supply the arms and legs with blood. Allow thirty minutes for this exam. There are no restrictions or special instructions. This will take place at [address] 4th floor  **To be done 1-2  weeks after your procedure (8/14)**   Please note: We ask at that you not bring children with you during ultrasound (echo/ vascular) testing. Due to room size and safety concerns, children are not allowed in the ultrasound rooms during exams. Our front office staff cannot provide observation of children in our lobby area while testing is being conducted. An adult accompanying a patient to their appointment will only be allowed in the ultrasound room at the discretion of the ultrasound technician under special circumstances. We apologize for any inconvenience.   Follow-Up: At [facility], you and your health needs are our priority.  As part of our continuing mission to provide you with exceptional heart care, our providers are all part of one team.  This team includes your primary Cardiologist (physician) and Advanced Practice Providers or APPs (Physician Assistants and Nurse Practitioners) who all work together to provide you with the care you need, when you need it.  Your next appointment:   2-3 week(s) after your procedure (8/14)  Provider:   Dorn Lesches, MD     We recommend signing up for the patient portal called MyChart.  Sign up information is provided on this After Visit Summary.  MyChart is used to connect with patients for Virtual Visits (Telemedicine).  Patients are able to view lab/test results, encounter notes, upcoming appointments, etc.  Non-urgent messages can be sent to your provider as well.   To learn more about what you can do with MyChart, go to [email or web address].   Other Instructions       Cardiac/Peripheral Catheterization   You  are scheduled for a Peripheral Angiogram on Thursday, August 14 with Dr. Dorn Lesches.  1. Please arrive at the [facility] (Main Entrance A) at [facility]: [address] [place], [place] [address] at 7:30 AM (This time is 2 hour(s) before your procedure to ensure your preparation).   Free valet parking  service is available. You will check in at ADMITTING. The support person will be asked to wait in the waiting room.  It is OK to have someone drop you off and come back when you are ready to be discharged.        Special note: Every effort is made to have your procedure done on time. Please understand that emergencies sometimes delay scheduled procedures.  2. Diet: Do not eat solid foods after midnight.  You may have clear liquids until 5 AM the day of the procedure.  3. Labs: You will need to have blood drawn today (7/15).  4. Medication instructions in preparation for your procedure:    Stop taking, lisinopril/hydrochlorothiazide  Thursday, August 14,    On the morning of your procedure, take Aspirin  81 mg and any morning medicines NOT listed above.  You may use sips of water.  5. Plan to go home the same day, you will only stay overnight if medically necessary. 6. You MUST have a responsible adult to drive you home. 7. An adult MUST be with you the first 24 hours after you arrive home. 8. Bring a current list of your medications, and the last time and date medication taken. 9. Bring ID and current insurance cards. 10.Please wear clothes that are easy to get on and off and wear slip-on shoes.  Thank you for allowing us  to care for you!   -- [facility]

## 2023-08-02 NOTE — Progress Notes (Signed)
 08/02/2023 [name] [facility]   [birth year]  [id number]  Primary Physician Glenford Harlene SAILOR, NP Primary Cardiologist: Dorn JINNY Lesches MD GENI CODY MADEIRA, FSCAI  HPI:  Aaron Jimenez is a 68 y.o.  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 He is accompanied by his wife Aaron Jimenez today. He was referred by Dr. Medford Amis, his primary cardiologist, for evaluation of claudication.  His risk factors include over 50-pack-year tobacco abuse having quit 10 years ago. He has treated hypertension and hyperlipidemia. He has never had a heart attack or stroke. He denies chest pain but does get some dyspnea on exertion. He and his wife unfortunately are not very active because of his claudication. He noticed onset of claudication 3 to 4 months ago which is lifestyle limiting, left greater than right. He did have Doppler studies performed 05/30/2023 revealing right ABI of 0.63, left of 0.71 with what appears to be a high-frequency signal in his distal right common femoral artery and distal right SFA, and principally tibial disease on the left  Since I saw him 2 weeks ago I did begin him on Pletal  which she did not tolerate.  He still complains of bilateral lower extremity lifestyle-limiting claudication and wishes to proceed with outpatient angiography and potential intervention.   Current Meds  Medication Sig   aspirin  EC 81 MG tablet Take 1 tablet (81 mg total) by mouth daily. Swallow whole.   atorvastatin  (LIPITOR) 80 MG tablet Take 1 tablet (80 mg total) by mouth daily.   ciclopirox  (PENLAC ) 8 % solution Apply topically at bedtime. Apply over nail and surrounding skin. Apply daily over previous coat. Remove weekly with polish remover.   ezetimibe  (ZETIA ) 10 MG tablet Take 1 tablet (10 mg total) by mouth daily.   FLUoxetine  (PROZAC ) 20 MG capsule Take 20 mg by mouth daily.   ibuprofen (ADVIL) 800 MG  tablet Take 800 mg by mouth 3 (three) times daily.   lisinopril (ZESTRIL) 10 MG tablet Take 10 mg by mouth daily.   lisinopril-hydrochlorothiazide (ZESTORETIC) 20-12.5 MG tablet Take 1 tablet by mouth daily.   meclizine (ANTIVERT) 25 MG tablet Take 25 mg by mouth as needed for nausea.   omeprazole (PRILOSEC) 40 MG capsule Take 40 mg by mouth daily. Take 1-2 capsules a day.   rOPINIRole (REQUIP XL) 2 MG 24 hr tablet Take 2 mg by mouth daily.   sertraline  (ZOLOFT ) 100 MG tablet Take 100 mg by mouth daily.   traMADol (ULTRAM) 50 MG tablet Take 50 mg by mouth 4 (four) times daily as needed for pain.     Allergies  Allergen Reactions   Bee Venom Anaphylaxis   Mixed Ragweed Shortness Of Breath   Molds & Smuts Other (See Comments)    Wheezing    Social History   Socioeconomic History   Marital status: Married    Spouse name: Not on file   Number of children: Not on file   Years of education: Not on file   Highest education level: Not on file  Occupational History   Not on file  Tobacco Use   Smoking status: Former   Smokeless tobacco: Not on file  Vaping Use   Vaping status: Never Used  Substance and Sexual Activity   Alcohol use: Yes    Comment: rare   Drug use: No   Sexual activity: Yes  Other Topics Concern  Not on file  Social History Narrative   Left handed    Lives with wife one story home   Caffeine a pot a day or more.   Welder - pipe fits ( works)   Social Drivers of Corporate investment banker Strain: Not on [employer] Insecurity: Not on file  Transportation Needs: Not on file  Physical Activity: Not on file  Stress: Not on file  Social Connections: Unknown (05/31/2021)   Received from [facility]   Social Network    Social Network: Not on file  Intimate Partner Violence: Unknown (04/22/2021)   Received from [facility]   HITS    Physically Hurt: Not on file    Insult or Talk Down To: Not on file    Threaten Physical Harm: Not on file    Scream or  Curse: Not on file     Review of Systems: General: negative for chills, fever, night sweats or weight changes.  Cardiovascular: negative for chest pain, dyspnea on exertion, edema, orthopnea, palpitations, paroxysmal nocturnal dyspnea or shortness of breath Dermatological: negative for rash Respiratory: negative for cough or wheezing Urologic: negative for hematuria Abdominal: negative for nausea, vomiting, diarrhea, bright red blood per rectum, melena, or hematemesis Neurologic: negative for visual changes, syncope, or dizziness All other systems reviewed and are otherwise negative except as noted above.    Blood pressure (!) 140/64, pulse 60, height 6' 3 (1.905 m), weight 218 lb 9.6 oz (99.2 kg), SpO2 98%.  General appearance: alert and no distress Neck: no adenopathy, no carotid bruit, no JVD, supple, symmetrical, trachea midline, and thyroid not enlarged, symmetric, no tenderness/mass/nodules Lungs: clear to auscultation bilaterally Heart: regular rate and rhythm, S1, S2 normal, no murmur, click, rub or gallop Extremities: extremities normal, atraumatic, no cyanosis or edema Pulses: Absent pedal pulses Skin: Skin color, texture, turgor normal. No rashes or lesions Neurologic: Grossly normal  EKG not performed today      ASSESSMENT AND PLAN:   Peripheral arterial disease (HCC) History of claudication with Dopplers performed 05/30/2023 revealed a right ABI of 0.63 and a left of 0.71.  He did have high-frequency signals in the right distal common femoral artery and distal SFA with an occluded posterior tibial.  His left side was principally tibial vessel disease.  I did empirically begin him on Pletal  which she did not tolerate.  He wishes to proceed with outpatient peripheral angiography and endovascular therapy for lifestyle-limiting claudication.     Dorn DOROTHA Lesches MD FACP,FACC,FAHA, [facility] 08/02/2023 9:20 AM

## 2023-08-02 NOTE — Assessment & Plan Note (Signed)
 History of claudication with Dopplers performed 05/30/2023 revealed a right ABI of 0.63 and a left of 0.71.  He did have high-frequency signals in the right distal common femoral artery and distal SFA with an occluded posterior tibial.  His left side was principally tibial vessel disease.  I did empirically begin him on Pletal  which she did not tolerate.  He wishes to proceed with outpatient peripheral angiography and endovascular therapy for lifestyle-limiting claudication.

## 2023-08-03 ENCOUNTER — Ambulatory Visit: Payer: Self-pay | Admitting: Cardiovascular Disease

## 2023-08-03 LAB — BASIC METABOLIC PANEL WITH GFR
BUN/Creatinine Ratio: 14 (ref 10–24)
BUN: 20 mg/dL (ref 8–27)
CO2: 18 mmol/L — ABNORMAL LOW (ref 20–29)
Calcium: 9.2 mg/dL (ref 8.6–10.2)
Chloride: 100 mmol/L (ref 96–106)
Creatinine, Ser: 1.4 mg/dL — ABNORMAL HIGH (ref 0.76–1.27)
Glucose: 102 mg/dL — ABNORMAL HIGH (ref 70–99)
Potassium: 5.3 mmol/L — ABNORMAL HIGH (ref 3.5–5.2)
Sodium: 136 mmol/L (ref 134–144)
eGFR: 54 mL/min/1.73 — ABNORMAL LOW (ref >59)

## 2023-08-03 LAB — CBC WITH DIFFERENTIAL/PLATELET
Basophils Absolute: 0.1 x10E3/uL (ref 0.0–0.2)
Basos: 1 %
EOS (ABSOLUTE): 1.4 x10E3/uL — ABNORMAL HIGH (ref 0.0–0.4)
Eos: 13 %
Hematocrit: 39.9 % (ref 37.5–51.0)
Hemoglobin: 12.9 g/dL — ABNORMAL LOW (ref 13.0–17.7)
Immature Grans (Abs): 0.1 x10E3/uL (ref 0.0–0.1)
Immature Granulocytes: 1 %
Lymphocytes Absolute: 2.3 x10E3/uL (ref 0.7–3.1)
Lymphs: 21 %
MCH: 30 pg (ref 26.6–33.0)
MCHC: 32.3 g/dL (ref 31.5–35.7)
MCV: 93 fL (ref 79–97)
Monocytes Absolute: 0.9 x10E3/uL (ref 0.1–0.9)
Monocytes: 8 %
Neutrophils Absolute: 6.4 x10E3/uL (ref 1.4–7.0)
Neutrophils: 56 %
Platelets: 375 x10E3/uL (ref 150–450)
RBC: 4.3 x10E6/uL (ref 4.14–5.80)
RDW: 13.2 % (ref 11.6–15.4)
WBC: 11.2 x10E3/uL — ABNORMAL HIGH (ref 3.4–10.8)

## 2023-08-04 ENCOUNTER — Telehealth: Payer: Self-pay | Admitting: Cardiovascular Disease

## 2023-08-04 DIAGNOSIS — R899 Unspecified abnormal finding in specimens from other organs, systems and tissues: Secondary | ICD-10-CM

## 2023-08-04 NOTE — Telephone Encounter (Signed)
 Spoke to pt's wife, informed of elevated creatinine and provider plan to repeat testing 1 week prior to testing on 8/14. She agrees to testing, will be sure pt has this done. She is aware more instruction needed prior to testing if this remains high. Lab order placed.

## 2023-08-04 NOTE — Telephone Encounter (Signed)
 Pt's wife returning call regarding pt's lab results. Please advise

## 2023-08-25 DIAGNOSIS — R899 Unspecified abnormal finding in specimens from other organs, systems and tissues: Secondary | ICD-10-CM | POA: Diagnosis not present

## 2023-08-26 LAB — BASIC METABOLIC PANEL WITH GFR
BUN/Creatinine Ratio: 10 (ref 10–24)
BUN: 15 mg/dL (ref 8–27)
CO2: 17 mmol/L — ABNORMAL LOW (ref 20–29)
Calcium: 8.7 mg/dL (ref 8.6–10.2)
Chloride: 108 mmol/L — ABNORMAL HIGH (ref 96–106)
Creatinine, Ser: 1.52 mg/dL — ABNORMAL HIGH (ref 0.76–1.27)
Glucose: 97 mg/dL (ref 70–99)
Potassium: 4.9 mmol/L (ref 3.5–5.2)
Sodium: 142 mmol/L (ref 134–144)
eGFR: 49 mL/min/1.73 — ABNORMAL LOW (ref >59)

## 2023-08-28 NOTE — H&P (Signed)
 08/28/23 Aaron Jimenez   12/13/1955  [id number]   Primary Physician Aaron Harlene SAILOR, NP Primary Cardiologist: Aaron Jimenez Aaron Lesches MD Aaron Jimenez, FSCAI   HPI:  Aaron Jimenez is a 68 y.o.  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 He is accompanied by his wife Aaron Jimenez today. He was referred by Dr. Medford Jimenez, his primary cardiologist, for evaluation of claudication.  His risk factors include over 50-pack-year tobacco abuse having quit 10 years ago. He has treated hypertension and hyperlipidemia. He has never had a heart attack or stroke. He denies chest pain but does get some dyspnea on exertion. He and his wife unfortunately are not very active because of his claudication. He noticed onset of claudication 3 to 4 months ago which is lifestyle limiting, left greater than right. He did have Doppler studies performed 05/30/2023 revealing right ABI of 0.63, left of 0.71 with what appears to be a high-frequency signal in his distal right common femoral artery and distal right SFA, and principally tibial disease on the left   Since I saw him 2 weeks ago I did begin him on Pletal  which she did not tolerate.  He still complains of bilateral lower extremity lifestyle-limiting claudication and wishes to proceed with outpatient angiography and potential intervention.     Active Medications      Current Meds  Medication Sig   aspirin  EC 81 MG tablet Take 1 tablet (81 mg total) by mouth daily. Swallow whole.   atorvastatin  (LIPITOR) 80 MG tablet Take 1 tablet (80 mg total) by mouth daily.   ciclopirox  (PENLAC ) 8 % solution Apply topically at bedtime. Apply over nail and surrounding skin. Apply daily over previous coat. Remove weekly with polish remover.   ezetimibe  (ZETIA ) 10 MG tablet Take 1 tablet (10 mg total) by mouth daily.   FLUoxetine  (PROZAC ) 20 MG capsule Take 20 mg by mouth daily.    ibuprofen (ADVIL) 800 MG tablet Take 800 mg by mouth 3 (three) times daily.   lisinopril (ZESTRIL) 10 MG tablet Take 10 mg by mouth daily.   lisinopril-hydrochlorothiazide (ZESTORETIC) 20-12.5 MG tablet Take 1 tablet by mouth daily.   meclizine (ANTIVERT) 25 MG tablet Take 25 mg by mouth as needed for nausea.   omeprazole (PRILOSEC) 40 MG capsule Take 40 mg by mouth daily. Take 1-2 capsules a day.   rOPINIRole (REQUIP XL) 2 MG 24 hr tablet Take 2 mg by mouth daily.   sertraline  (ZOLOFT ) 100 MG tablet Take 100 mg by mouth daily.   traMADol (ULTRAM) 50 MG tablet Take 50 mg by mouth 4 (four) times daily as needed for pain.        Allergies       Allergies  Allergen Reactions   Bee Venom Anaphylaxis   Mixed Ragweed Shortness Of Breath   Molds & Smuts Other (See Comments)      Wheezing        Social History         Socioeconomic History   Marital status: Married      Spouse name: Not on file   Number of children: Not on file   Years of education: Not on file   Highest education level: Not on file  Occupational History   Not on file  Tobacco Use   Smoking status: Former   Smokeless tobacco:  Not on file  Vaping Use   Vaping status: Never Used  Substance and Sexual Activity   Alcohol use: Yes      Comment: rare   Drug use: No   Sexual activity: Yes  Other Topics Concern   Not on file  Social History Narrative    Left handed     Lives with wife one story home    Caffeine a pot a day or more.    [employer] - pipe fits ( works)    Social Drivers of Acupuncturist Strain: Not on [employer] Insecurity: Not on file  Transportation Needs: Not on file  Physical Activity: Not on file  Stress: Not on file  Social Connections: Unknown (05/31/2021)    Received from [facility]    Social Network     Social Network: Not on file  Intimate Partner Violence: Unknown (04/22/2021)    Received from [facility]    HITS     Physically Hurt: Not on file      Insult or Talk Down To: Not on file     Threaten Physical Harm: Not on file     Scream or Curse: Not on file      Review of Systems: General: negative for chills, fever, night sweats or weight changes.  Cardiovascular: negative for chest pain, dyspnea on exertion, edema, orthopnea, palpitations, paroxysmal nocturnal dyspnea or shortness of breath Dermatological: negative for rash Respiratory: negative for cough or wheezing Urologic: negative for hematuria Abdominal: negative for nausea, vomiting, diarrhea, bright red blood per rectum, melena, or hematemesis Neurologic: negative for visual changes, syncope, or dizziness All other systems reviewed and are otherwise negative except as noted above.       Blood pressure (!) 140/64, pulse 60, height 6' 3 (1.905 m), weight 218 lb 9.6 oz (99.2 kg), SpO2 98%.  General appearance: alert and no distress Neck: no adenopathy, no carotid bruit, no JVD, supple, symmetrical, trachea midline, and thyroid not enlarged, symmetric, no tenderness/mass/nodules Lungs: clear to auscultation bilaterally Heart: regular rate and rhythm, S1, S2 normal, no murmur, click, rub or gallop Extremities: extremities normal, atraumatic, no cyanosis or edema Pulses: Absent pedal pulses Skin: Skin color, texture, turgor normal. No rashes or lesions Neurologic: Grossly normal   EKG not performed today       ASSESSMENT AND PLAN:    Peripheral arterial disease (HCC) History of claudication with Dopplers performed 05/30/2023 revealed a right ABI of 0.63 and a left of 0.71.  He did have high-frequency signals in the right distal common femoral artery and distal SFA with an occluded posterior tibial.  His left side was principally tibial vessel disease.  I did empirically begin him on Pletal  which she did not tolerate.  He wishes to proceed with outpatient peripheral angiography and endovascular therapy for lifestyle-limiting claudication.     Aaron Jimenez Aaron Jimenez Jimenez, M.D., FACP,  Aaron Jimenez, FAHA, [facility]  [address] Aaron Jimenez, [place]  [address]  [phone] 08/28/2023 11:33 AM

## 2023-08-30 ENCOUNTER — Telehealth: Payer: Self-pay | Admitting: *Deleted

## 2023-08-30 NOTE — Telephone Encounter (Addendum)
 LEA  scheduled at [facility] for: Thursday September 01, 2023 9:30 AM Arrival time [facility] Main Entrance A at: 5:30 AM-pre-procedure hydration   Diet: -Nothing to eat after midnight prior to procedure.  Hydration: -May drink clear liquids until leaving for hospital. Approved liquids: Water, clear tea, black coffee, fruit juices-non-citric and without pulp,Gatorade, plain Jello/popsicles.  No PO hydration (bottle of water) OTW to hospital-going in early IVF.  Medication instructions: -Hold:  Lisinopril /HCT-day before and day of procedure -per protocol GFR < 60 (49) Other usual morning medications can be taken including aspirin  81 mg.  Plan to go home the same day, you will only stay overnight if medically necessary.  You must have responsible adult to drive you home.  Someone must be with you the first 24 hours after you arrive home.  Reviewed procedure instructions/pre-procedure hydration with patient's wife (DPR).

## 2023-09-01 ENCOUNTER — Other Ambulatory Visit: Payer: Self-pay

## 2023-09-01 ENCOUNTER — Ambulatory Visit (HOSPITAL_COMMUNITY)
Admission: RE | Admit: 2023-09-01 | Discharge: 2023-09-02 | Disposition: A | Discharge status: Home / Self Care | Attending: Cardiovascular Disease | Admitting: Cardiovascular Disease

## 2023-09-01 ENCOUNTER — Encounter (HOSPITAL_COMMUNITY)
Admission: RE | Disposition: A | Discharge status: Home / Self Care | Payer: Self-pay | Source: Home / Self Care | Attending: Cardiovascular Disease

## 2023-09-01 DIAGNOSIS — K648 Other hemorrhoids: Secondary | ICD-10-CM | POA: Insufficient documentation

## 2023-09-01 DIAGNOSIS — Z79899 Other long term (current) drug therapy: Secondary | ICD-10-CM | POA: Diagnosis not present

## 2023-09-01 DIAGNOSIS — I70213 Atherosclerosis of native arteries of extremities with intermittent claudication, bilateral legs: Secondary | ICD-10-CM | POA: Diagnosis not present

## 2023-09-01 DIAGNOSIS — E785 Hyperlipidemia, unspecified: Secondary | ICD-10-CM | POA: Insufficient documentation

## 2023-09-01 DIAGNOSIS — Z87891 Personal history of nicotine dependence: Secondary | ICD-10-CM | POA: Diagnosis not present

## 2023-09-01 DIAGNOSIS — Z7982 Long term (current) use of aspirin: Secondary | ICD-10-CM | POA: Insufficient documentation

## 2023-09-01 DIAGNOSIS — I739 Peripheral vascular disease, unspecified: Secondary | ICD-10-CM | POA: Diagnosis present

## 2023-09-01 DIAGNOSIS — N189 Chronic kidney disease, unspecified: Secondary | ICD-10-CM | POA: Diagnosis not present

## 2023-09-01 DIAGNOSIS — I129 Hypertensive chronic kidney disease with stage 1 through stage 4 chronic kidney disease, or unspecified chronic kidney disease: Secondary | ICD-10-CM | POA: Insufficient documentation

## 2023-09-01 DIAGNOSIS — Z7902 Long term (current) use of antithrombotics/antiplatelets: Secondary | ICD-10-CM | POA: Diagnosis not present

## 2023-09-01 HISTORY — PX: LOWER EXTREMITY INTERVENTION: CATH118252

## 2023-09-01 HISTORY — PX: LOWER EXTREMITY ANGIOGRAPHY: CATH118251

## 2023-09-01 HISTORY — DX: Peripheral vascular disease, unspecified: I73.9

## 2023-09-01 LAB — GLUCOSE, CAPILLARY: Glucose-Capillary: 80 mg/dL (ref 70–99)

## 2023-09-01 LAB — POCT ACTIVATED CLOTTING TIME
Activated Clotting Time: 205 s
Activated Clotting Time: 227 s
Activated Clotting Time: 245 s
Activated Clotting Time: 193 s

## 2023-09-01 MED ORDER — SODIUM CHLORIDE 0.9 % WEIGHT BASED INFUSION
3.0000 mL/kg/h | INTRAVENOUS | Status: DC
  Administered 2023-09-01: 3 mL/kg/h via INTRAVENOUS

## 2023-09-01 MED ORDER — EZETIMIBE 10 MG PO TABS
10.0000 mg | ORAL_TABLET | Freq: Every day | ORAL | Status: DC
  Administered 2023-09-01 – 2023-09-02 (×2): 10 mg via ORAL
  Filled 2023-09-01 (×2): qty 1

## 2023-09-01 MED ORDER — HEPARIN (PORCINE) IN NACL 1000-0.9 UT/500ML-% IV SOLN
INTRAVENOUS | Status: DC | PRN
  Administered 2023-09-01: 1000 mL via SURGICAL_CAVITY

## 2023-09-01 MED ORDER — HYDRALAZINE HCL 20 MG/ML IJ SOLN: 10.0000 mg | Freq: Once | INTRAMUSCULAR | Status: AC

## 2023-09-01 MED ORDER — SODIUM CHLORIDE 0.9 % IV SOLN: 250.0000 mL | INTRAVENOUS | Status: AC | PRN

## 2023-09-01 MED ORDER — MORPHINE SULFATE (PF) 2 MG/ML IV SOLN: 2.0000 mg | INTRAVENOUS | Status: DC | PRN

## 2023-09-01 MED ORDER — SODIUM CHLORIDE 0.9 % IV SOLN: INTRAVENOUS | Status: AC

## 2023-09-01 MED ORDER — SODIUM CHLORIDE 0.9% FLUSH
3.0000 mL | Freq: Two times a day (BID) | INTRAVENOUS | Status: DC
  Administered 2023-09-01 – 2023-09-02 (×2): 3 mL via INTRAVENOUS

## 2023-09-01 MED ORDER — SERTRALINE HCL 100 MG PO TABS
100.0000 mg | ORAL_TABLET | Freq: Every day | ORAL | Status: DC
  Administered 2023-09-01 – 2023-09-02 (×2): 100 mg via ORAL
  Filled 2023-09-01 (×2): qty 1

## 2023-09-01 MED ORDER — LIDOCAINE HCL (PF) 1 % IJ SOLN
INTRAMUSCULAR | Status: DC | PRN
  Administered 2023-09-01: 10 mL

## 2023-09-01 MED ORDER — HYDRALAZINE HCL 20 MG/ML IJ SOLN
INTRAMUSCULAR | Status: AC
  Administered 2023-09-01: 10 mg via INTRAVENOUS
  Filled 2023-09-01: qty 1

## 2023-09-01 MED ORDER — ONDANSETRON HCL 4 MG/2ML IJ SOLN: 4.0000 mg | Freq: Four times a day (QID) | INTRAMUSCULAR | Status: DC | PRN

## 2023-09-01 MED ORDER — FENTANYL CITRATE (PF) 100 MCG/2ML IJ SOLN
INTRAMUSCULAR | Status: AC
  Filled 2023-09-01: qty 2

## 2023-09-01 MED ORDER — HYDROCHLOROTHIAZIDE 12.5 MG PO TABS
12.5000 mg | ORAL_TABLET | Freq: Every day | ORAL | Status: DC
  Administered 2023-09-01 – 2023-09-02 (×2): 12.5 mg via ORAL
  Filled 2023-09-01 (×2): qty 1

## 2023-09-01 MED ORDER — HYDRALAZINE HCL 20 MG/ML IJ SOLN
INTRAMUSCULAR | Status: AC
Start: 2023-09-01 — End: 2023-09-02
  Filled 2023-09-01: qty 1

## 2023-09-01 MED ORDER — HEPARIN SODIUM (PORCINE) 1000 UNIT/ML IJ SOLN
INTRAMUSCULAR | Status: DC | PRN
  Administered 2023-09-01: 4000 [IU] via INTRAVENOUS
  Administered 2023-09-01: 10000 [IU] via INTRAVENOUS
  Administered 2023-09-01: 4000 [IU] via INTRAVENOUS

## 2023-09-01 MED ORDER — ASPIRIN 81 MG PO TBEC
81.0000 mg | DELAYED_RELEASE_TABLET | Freq: Every day | ORAL | Status: DC
  Administered 2023-09-02: 81 mg via ORAL
  Filled 2023-09-01: qty 1

## 2023-09-01 MED ORDER — HEPARIN SODIUM (PORCINE) 1000 UNIT/ML IJ SOLN
INTRAMUSCULAR | Status: AC
Start: 2023-09-01 — End: 2023-09-01
  Filled 2023-09-01: qty 10

## 2023-09-01 MED ORDER — LABETALOL HCL 5 MG/ML IV SOLN: 10.0000 mg | INTRAVENOUS | Status: DC | PRN

## 2023-09-01 MED ORDER — TRAMADOL HCL 50 MG PO TABS: 50.0000 mg | ORAL_TABLET | Freq: Four times a day (QID) | ORAL | Status: DC | PRN

## 2023-09-01 MED ORDER — SODIUM CHLORIDE 0.9 % WEIGHT BASED INFUSION: 1.0000 mL/kg/h | INTRAVENOUS | Status: DC

## 2023-09-01 MED ORDER — CLOPIDOGREL BISULFATE 300 MG PO TABS
ORAL_TABLET | ORAL | Status: AC
  Filled 2023-09-01: qty 1

## 2023-09-01 MED ORDER — ASPIRIN 81 MG PO CHEW: 81.0000 mg | CHEWABLE_TABLET | ORAL | Status: DC

## 2023-09-01 MED ORDER — LISINOPRIL 20 MG PO TABS
20.0000 mg | ORAL_TABLET | Freq: Every day | ORAL | Status: DC
  Administered 2023-09-01 – 2023-09-02 (×2): 20 mg via ORAL
  Filled 2023-09-01 (×2): qty 1

## 2023-09-01 MED ORDER — LISINOPRIL-HYDROCHLOROTHIAZIDE 20-12.5 MG PO TABS: 1.0000 | ORAL_TABLET | Freq: Every day | ORAL | Status: DC

## 2023-09-01 MED ORDER — LIDOCAINE HCL (PF) 1 % IJ SOLN
INTRAMUSCULAR | Status: AC
  Filled 2023-09-01: qty 30

## 2023-09-01 MED ORDER — SODIUM CHLORIDE 0.9% FLUSH
3.0000 mL | INTRAVENOUS | Status: DC | PRN
  Administered 2023-09-01: 3 mL via INTRAVENOUS

## 2023-09-01 MED ORDER — CLOPIDOGREL BISULFATE 300 MG PO TABS
ORAL_TABLET | ORAL | Status: DC | PRN
  Administered 2023-09-01: 300 mg via ORAL

## 2023-09-01 MED ORDER — FENTANYL CITRATE (PF) 100 MCG/2ML IJ SOLN
INTRAMUSCULAR | Status: DC | PRN
  Administered 2023-09-01: 25 ug via INTRAVENOUS

## 2023-09-01 MED ORDER — HEPARIN SODIUM (PORCINE) 1000 UNIT/ML IJ SOLN
INTRAMUSCULAR | Status: AC
  Filled 2023-09-01: qty 10

## 2023-09-01 MED ORDER — MIDAZOLAM HCL 2 MG/2ML IJ SOLN
INTRAMUSCULAR | Status: AC
  Filled 2023-09-01: qty 2

## 2023-09-01 MED ORDER — ASPIRIN 81 MG PO TBEC: 81.0000 mg | DELAYED_RELEASE_TABLET | Freq: Every day | ORAL | Status: DC

## 2023-09-01 MED ORDER — IODIXANOL 320 MG/ML IV SOLN
INTRAVENOUS | Status: DC | PRN
  Administered 2023-09-01: 120 mL via INTRA_ARTERIAL

## 2023-09-01 MED ORDER — MIDAZOLAM HCL 2 MG/2ML IJ SOLN
INTRAMUSCULAR | Status: DC | PRN
  Administered 2023-09-01: 1 mg via INTRAVENOUS

## 2023-09-01 MED ORDER — ACETAMINOPHEN 325 MG PO TABS
650.0000 mg | ORAL_TABLET | ORAL | Status: DC | PRN
Start: 2023-09-01 — End: 2023-09-02

## 2023-09-01 MED ORDER — FLUOXETINE HCL 20 MG PO CAPS: 80.0000 mg | ORAL_CAPSULE | Freq: Every day | ORAL | Status: DC

## 2023-09-01 MED ORDER — ATORVASTATIN CALCIUM 80 MG PO TABS
80.0000 mg | ORAL_TABLET | Freq: Every day | ORAL | Status: DC
  Administered 2023-09-01 – 2023-09-02 (×2): 80 mg via ORAL
  Filled 2023-09-01 (×2): qty 1

## 2023-09-01 MED ORDER — HYDRALAZINE HCL 20 MG/ML IJ SOLN
5.0000 mg | INTRAMUSCULAR | Status: AC | PRN
  Administered 2023-09-01 (×2): 5 mg via INTRAVENOUS

## 2023-09-01 MED ORDER — PANTOPRAZOLE SODIUM 40 MG PO TBEC
40.0000 mg | DELAYED_RELEASE_TABLET | Freq: Every day | ORAL | Status: DC
  Administered 2023-09-01 – 2023-09-02 (×2): 40 mg via ORAL
  Filled 2023-09-01 (×2): qty 1

## 2023-09-01 MED ORDER — CLOPIDOGREL BISULFATE 75 MG PO TABS
75.0000 mg | ORAL_TABLET | Freq: Every day | ORAL | Status: DC
  Administered 2023-09-02: 75 mg via ORAL
  Filled 2023-09-01: qty 1

## 2023-09-01 NOTE — Progress Notes (Signed)
 Site area: L femoral (arterial) Site Prior to Removal:  Level 1 (large ooze) Pressure Applied For: 50 min Manual:   yes Patient Status During Pull:  stable/hypertensive Post Pull Site:  Level 0 Post Pull Instructions Given:  yes Post Pull Pulses Present: bilateral DP +1 Dressing Applied:  gauze & tegaderm Bedrest begins @ 1550 Comments: pt w/ continually oozy groin, hypertension largely uncontrolled. Unable to give labetalol  due to HR in 58-61bpm range; all doses hydralazine  given. Released Zestortic; awaiting pharmacy approval and will give when no longer NPO. Requested additional PRN hydralazine  from [facility], Aaron Jimenez. additional pressure held at groin site d/t beginning of hematoma formation while releasing pressure at 30 min. Site stable at 50 min hold time.

## 2023-09-01 NOTE — Interval H&P Note (Signed)
 History and Physical Interval Note:  09/01/2023 9:51 AM  Nadara [facility]N [facility]  has presented today for surgery, with the diagnosis of pad.  The various methods of treatment have been discussed with the patient and family. After consideration of risks, benefits and other options for treatment, the patient has consented to  Procedure(s): Lower Extremity Angiography (N/A) as a surgical intervention.  The patient's history has been reviewed, patient examined, no change in status, stable for surgery.  I have reviewed the patient's chart and labs.  Questions were answered to the patient's satisfaction.     Dorn Lesches

## 2023-09-02 ENCOUNTER — Other Ambulatory Visit (HOSPITAL_COMMUNITY): Payer: Self-pay

## 2023-09-02 ENCOUNTER — Encounter (HOSPITAL_COMMUNITY): Payer: Self-pay | Admitting: Cardiovascular Disease

## 2023-09-02 DIAGNOSIS — Z79899 Other long term (current) drug therapy: Secondary | ICD-10-CM | POA: Diagnosis not present

## 2023-09-02 DIAGNOSIS — I739 Peripheral vascular disease, unspecified: Secondary | ICD-10-CM | POA: Diagnosis not present

## 2023-09-02 DIAGNOSIS — K625 Hemorrhage of anus and rectum: Secondary | ICD-10-CM

## 2023-09-02 DIAGNOSIS — Z87891 Personal history of nicotine dependence: Secondary | ICD-10-CM | POA: Diagnosis not present

## 2023-09-02 DIAGNOSIS — E785 Hyperlipidemia, unspecified: Secondary | ICD-10-CM | POA: Diagnosis not present

## 2023-09-02 DIAGNOSIS — Z7902 Long term (current) use of antithrombotics/antiplatelets: Secondary | ICD-10-CM | POA: Diagnosis not present

## 2023-09-02 DIAGNOSIS — N189 Chronic kidney disease, unspecified: Secondary | ICD-10-CM | POA: Diagnosis not present

## 2023-09-02 DIAGNOSIS — K648 Other hemorrhoids: Secondary | ICD-10-CM | POA: Diagnosis not present

## 2023-09-02 DIAGNOSIS — I129 Hypertensive chronic kidney disease with stage 1 through stage 4 chronic kidney disease, or unspecified chronic kidney disease: Secondary | ICD-10-CM | POA: Diagnosis not present

## 2023-09-02 DIAGNOSIS — I70213 Atherosclerosis of native arteries of extremities with intermittent claudication, bilateral legs: Secondary | ICD-10-CM | POA: Diagnosis not present

## 2023-09-02 DIAGNOSIS — Z7982 Long term (current) use of aspirin: Secondary | ICD-10-CM | POA: Diagnosis not present

## 2023-09-02 LAB — LIPID PANEL
Cholesterol: 168 mg/dL (ref 0–200)
HDL: 53 mg/dL (ref >40)
LDL Cholesterol: 94 mg/dL (ref 0–99)
Total CHOL/HDL Ratio: 3.2 ratio
Triglycerides: 104 mg/dL (ref <150)
VLDL: 21 mg/dL (ref 0–40)

## 2023-09-02 LAB — CBC
HCT: 33.1 % — ABNORMAL LOW (ref 39.0–52.0)
Hemoglobin: 11.3 g/dL — ABNORMAL LOW (ref 13.0–17.0)
MCH: 30.5 pg (ref 26.0–34.0)
MCHC: 34.1 g/dL (ref 30.0–36.0)
MCV: 89.2 fL (ref 80.0–100.0)
Platelets: 259 K/uL (ref 150–400)
RBC: 3.71 MIL/uL — ABNORMAL LOW (ref 4.22–5.81)
RDW: 13.2 % (ref 11.5–15.5)
WBC: 8.8 K/uL (ref 4.0–10.5)
nRBC: 0 % (ref 0.0–0.2)

## 2023-09-02 LAB — BASIC METABOLIC PANEL WITH GFR
Anion gap: 9 (ref 5–15)
BUN: 17 mg/dL (ref 8–23)
CO2: 21 mmol/L — ABNORMAL LOW (ref 22–32)
Calcium: 8.5 mg/dL — ABNORMAL LOW (ref 8.9–10.3)
Chloride: 108 mmol/L (ref 98–111)
Creatinine, Ser: 1.32 mg/dL — ABNORMAL HIGH (ref 0.61–1.24)
GFR, Estimated: 58 mL/min — ABNORMAL LOW (ref >60)
Glucose, Bld: 100 mg/dL — ABNORMAL HIGH (ref 70–99)
Potassium: 3.6 mmol/L (ref 3.5–5.1)
Sodium: 138 mmol/L (ref 135–145)

## 2023-09-02 LAB — HEMOGLOBIN AND HEMATOCRIT, BLOOD
HCT: 37.1 % — ABNORMAL LOW (ref 39.0–52.0)
Hemoglobin: 12.4 g/dL — ABNORMAL LOW (ref 13.0–17.0)

## 2023-09-02 MED ORDER — PANTOPRAZOLE SODIUM 40 MG PO TBEC
40.0000 mg | DELAYED_RELEASE_TABLET | Freq: Every day | ORAL | 0 refills | Status: AC
  Filled 2023-09-02: qty 90, 90d supply, fill #0

## 2023-09-02 MED ORDER — CLOPIDOGREL BISULFATE 75 MG PO TABS
75.0000 mg | ORAL_TABLET | Freq: Every day | ORAL | 1 refills | Status: AC
  Filled 2023-09-02: qty 90, 90d supply, fill #0

## 2023-09-02 NOTE — Discharge Summary (Signed)
 Discharge Summary   Patient ID: Aaron Jimenez MRN: [id number]; DOB: [birth year]  Admit date: 09/01/2023 Discharge date: 09/02/2023  PCP:  Glenford Harlene SAILOR, NP   [facility] Providers Cardiologist:  Dorn Lesches, MD     Discharge Diagnoses  Principal Problem:   Claudication in peripheral vascular disease [facility])   Diagnostic Studies/Procedures   PV angiogram: 09/01/2023  Final Impression: Successful right SFA shockwave balloon angioplasty followed by [facility] with one-vessel runoff.  He has residual left SFA disease with one-vessel runoff via a diseased peroneal.  The sheath will be removed once ACT falls below 170 pressure held.  He will be hydrated overnight and discharged home in the morning on DAPT (aspirin  and clopidogrel ).  He will get lower extremity arterial Doppler studies in our [employer] office next week and I will see him back 1 to 2 weeks thereafter.  At that time we will assess clinical benefit and discuss staged left SFA intervention.    Dorn Lesches. MD, [facility] 09/01/2023 _____________   History of Present Illness   Aaron Jimenez is a 68 y.o. male with PMH of tobacco use, HTN, HLD who was referred to Dr. Lesches for claudication by his primary cardiology, Dr. Stark. He and his wife reported that he had not been very active because of claudication symptoms which started about 3-4 months prior. Doppler studies performed 05/30/2023 revealing right ABI of 0.63, left of 0.71 with what appearred to be a high-frequency signal in his distal right common femoral artery and distal right SFA, and principally tibial disease on the left. He was started on Pletal  and did not tolerate, therefore was set for outpatient outpatient PV angiogram.    Hospital Course   Consultants: GI   PAD -- underwent PV angiogram as noted above with successful drug coated balloon to right SFA with recommendations for ASA, plavix  for at least 3 months -- continue Lipitor 80mg  daily, zetia  10mg   daily  -- continue lisinopril /hydrochlorothiazide   Painless rectal bleeding -- one episode overnight reported by RN, no recent colonoscopy, no prior history of rectal bleeding  -- Hgb down 1.6 points since prior about a month ago, repeat Hgb stable at 12.4 -- seen by GI and recommended monitoring. Patient was felt to be stable for discharge, no plan for inpatient work up. May use Preparation H as needed for internal hemorrhoid.   -- switched to protonix  with the need for plavix    _____________  Discharge Vitals Blood pressure 121/85, pulse 70, temperature 98.4 F (36.9 C), temperature source Oral, resp. rate 18, height 6' 3 (1.905 m), weight 98 kg, SpO2 100%.  Filed Weights   09/01/23 0555  Weight: 98 kg    Labs & Radiologic Studies  CBC Recent Labs    09/02/23 0115 09/02/23 1142  WBC 8.8  --   HGB 11.3* 12.4*  HCT 33.1* 37.1*  MCV 89.2  --   PLT 259  --    Basic Metabolic Panel Recent Labs    91/84/74 0115  NA 138  K 3.6  CL 108  CO2 21*  GLUCOSE 100*  BUN 17  CREATININE 1.32*  CALCIUM  8.5*   Liver Function Tests No results for input(s): AST, ALT, ALKPHOS, BILITOT, PROT, ALBUMIN in the last 72 hours. No results for input(s): LIPASE, AMYLASE in the last 72 hours. High Sensitivity Troponin:   No results for input(s): TROPONINIHS in the last 720 hours.  No results for input(s): TRNPT in the last 720 hours.  BNP Invalid input(s):  POCBNP No results for input(s): PROBNP in the last 72 hours.  No results for input(s): BNP in the last 72 hours.  D-Dimer No results for input(s): DDIMER in the last 72 hours. Hemoglobin A1C No results for input(s): HGBA1C in the last 72 hours. Fasting Lipid Panel Recent Labs    09/02/23 0114  CHOL 168  HDL 53  LDLCALC 94  TRIG 104  CHOLHDL 3.2   No results found for: LIPOA  Thyroid Function Tests No results for input(s): TSH, T4TOTAL, T3FREE, THYROIDAB in the last 72  hours.  Invalid input(s): FREET3 _____________  PERIPHERAL VASCULAR CATHETERIZATION Result Date: 09/01/2023 Images from the original result were not included.  [id number] LOCATION:  FACILITY: [facility] PHYSICIAN: Dorn Lesches, M.D. 1955/01/29 DATE OF PROCEDURE:  09/01/2023 DATE OF DISCHARGE: PV Angiogram/Intervention History obtained from chart review.Aaron Jimenez is a 68 y.o.  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 He is accompanied by his wife [place] today. He was referred by Dr. Medford Amis, his primary cardiologist, for evaluation of claudication.  His risk factors include over 50-pack-year tobacco abuse having quit 10 years ago. He has treated hypertension and hyperlipidemia. He has never had a heart attack or stroke. He denies chest pain but does get some dyspnea on exertion. He and his wife unfortunately are not very active because of his claudication. He noticed onset of claudication 3 to 4 months ago which is lifestyle limiting, left greater than right. He did have Doppler studies performed 05/30/2023 revealing right ABI of 0.63, left of 0.71 with what appears to be a high-frequency signal in his distal right common femoral artery and distal right SFA, and principally tibial disease on the left  Since I saw him 2 weeks ago I did begin him on Pletal  which she did not tolerate.  He still complains of bilateral lower extremity lifestyle-limiting claudication and wishes to proceed with outpatient angiography and potential intervention. Pre Procedure Diagnosis: Peripheral arterial disease Post Procedure Diagnosis: Peripheral arterial disease Operators: Dr. Dorn Lesches Procedures Performed:  1.  Ultrasound-guided left common femoral access  2.  Abdominal aortogram/bilateral iliac angiogram/bifemoral angiography  3.  Contralateral access (secondary catheter placement)  4.  Chocolate balloon angioplasty  followed by DCB mid right SFA PROCEDURE DESCRIPTION: The patient was brought to the second floor [place] [facility] cath lab in the the postabsorptive state. He was premedicated with IV Versed  and fentanyl . His left groin was prepped and shaved in usual sterile fashion. Xylocaine  1% was used for local anesthesia. A 5 French sheath was inserted into the left common femoral artery using standard Seldinger technique.  Ultrasound was used to identify the left common femoral artery and guide access.  Additional images captured and placed the patient chart.  A 5 French catheter was placed in the distal abdominal aorta.  Distal abdominal aortography, bilateral iliac angiography and bilateral lower extremity angiography were performed using Omnipaque  dye (120 cc cc of contrast total palpation).  Retrograde aortic pressures monitored on the case.  Angiographic Data: 1: Abdominal aorta-renal arteries widely patent.  The infrarenal abdominal aorta was tortuous. 2: Left lower extremity-80% proximal and mid/distal left SFA with one-vessel runoff via a diseased peroneal. 3: Right lower extremity-40% proximal right SFA stenosis, 90% mid with one-vessel runoff via an anterior tibial.  The tibioperoneal trunk is occluded and the peroneal reconstitutes.   Mr. Jurewicz  has bilateral SFA disease as  well as tibial disease.  Will proceed with right SFA Will plan angioplasty followed by DCB. Procedure Description: Contralateral access was obtained with a crossover catheter, glide wire, versa core wire and Rosen wire.  I exchanged the 5 French sheath in the left common femoral artery for a 6 French/45 cm catapult sheath.  The patient received 18,000 units of heparin  within an ACT of 245.  Omnipaque  dye was used for the entirety of the case (total contrast the patient 120 cc).  Retrograde aortic pressures monitored during the case.  I crossed the mid SFA lesion with a 0.18 V 18 wire.  I then dilated with a 5 mm x 4 cm chocolate balloon  abdominal pressures for 4 minutes.  On this I performed DCB with a 6 mm x 60 mm Ranger DCB at 4 atm for 4 minutes resulting in reduction of a 90% fairly focal mid right SFA stenosis to less than 20% with a small nonflow limiting linear dissection.  Patient tolerated the procedure well.  The wire was removed.  The conical sheath was removed over the Rosen wire and exchanged for short 6 French sheath which was then secured in place.  Patient did receive 300 mg of clopidogrel  given the case.  He left from stable condition. Final Impression: Successful right SFA shockwave balloon angioplasty followed by [facility] with one-vessel runoff.  He has residual left SFA disease with one-vessel runoff via a diseased peroneal.  The sheath will be removed once ACT falls below 170 pressure held.  He will be hydrated overnight and discharged home in the morning on DAPT (aspirin  and clopidogrel ).  He will get lower extremity arterial Doppler studies in our [employer] office next week and I will see him back 1 to 2 weeks thereafter.  At that time we will assess clinical benefit and discuss staged left SFA intervention. Dorn Lesches. MD, [facility] 09/01/2023 11:37 AM     Disposition Pt is being discharged home today in good condition.  Follow-up Plans & Appointments  Follow-up Information      [facility] [place]. [facility] Follow up on 09/15/2023.   Specialty: Cardiology Why: @10AM  repeat ABI   @11AM  Lower extremity arterial doppler Contact information: [address] [place]  [address] [phone]        Lesches Dorn PARAS, MD Follow up on 09/27/2023.   Specialties: Cardiology, Radiology Why: 11:30AM. Cardiology follow up Contact information: [address] [place] [address] [phone]                  Discharge Medications Allergies as of 09/02/2023       Reactions   Bee Venom Anaphylaxis   Mixed Ragweed Shortness Of Breath    Molds & Smuts Other (See Comments)   Wheezing        Medication List     STOP taking these medications    naproxen 500 MG tablet Commonly known as: NAPROSYN   omeprazole 40 MG capsule Commonly known as: PRILOSEC Replaced by: pantoprazole  40 MG tablet       TAKE these medications    aspirin  EC 81 MG tablet Take 1 tablet (81 mg total) by mouth daily. Swallow whole.   atorvastatin  80 MG tablet Commonly known as: LIPITOR Take 1 tablet (80 mg total) by mouth daily.   ciclopirox  8 % solution Commonly known as: PENLAC  Apply topically at bedtime. Apply over nail and surrounding skin. Apply daily over previous coat.  Remove weekly with polish remover.   clopidogrel  75 MG tablet Commonly known as: PLAVIX  Take 1 tablet (75 mg total) by mouth daily with breakfast. Start taking on: September 03, 2023   ezetimibe  10 MG tablet Commonly known as: ZETIA  Take 1 tablet (10 mg total) by mouth daily.   FLUoxetine  40 MG capsule Commonly known as: PROZAC  Take 80 mg by mouth daily.   lisinopril -hydrochlorothiazide  20-12.5 MG tablet Commonly known as: ZESTORETIC  Take 1 tablet by mouth daily.   pantoprazole  40 MG tablet Commonly known as: PROTONIX  Take 1 tablet (40 mg total) by mouth daily. Start taking on: September 03, 2023 Replaces: omeprazole 40 MG capsule   sertraline  100 MG tablet Commonly known as: ZOLOFT  Take 100 mg by mouth daily.   traMADol  50 MG tablet Commonly known as: ULTRAM  Take 50 mg by mouth 4 (four) times daily as needed for pain.         Outstanding Labs/Studies N/A  Duration of Discharge Encounter: APP Time: 20 minutes   Signed, Scot Ford, PA 09/02/2023, 5:11 PM

## 2023-09-02 NOTE — Progress Notes (Signed)
 Contacted cardiology on call and advised pt is bleeding from his rectum. Pt pad on his bed was soiled completely with blood, but the pt was unaware of the bleeding and did not feel anything. Pt also denied any weakness. The access site at the left groin is still clean dry and intact. Cardiologist on call advised RN to continue to monitor the patient and will do a H&H to make sure there was not excessive blood loss.

## 2023-09-02 NOTE — Progress Notes (Signed)
   Patient Name: [facility] Date of Encounter: 09/02/2023 [facility] Cardiologist: Aaron Fitch, MD Vascular internationalist: Aaron Lesches, MD  Interval Summary  .    Reported episode of painless rectal bleeding overnight. No recent colonoscopy. No prior history of rectal bleeding. Patient is status post peripheral intervention on 8/14, now on DAPT with aspirin  and Plavix .  Vital Signs .    Vitals:   09/02/23 0700 09/02/23 0726 09/02/23 0800 09/02/23 0832  BP: 125/69 125/70 127/70 127/70  Pulse: 61 62 68   Resp: 16 17 16    Temp: 97.9 F (36.6 C) 98.1 F (36.7 C)    TempSrc: Oral Oral    SpO2: 99% 98% 100%   Weight:      Height:        Intake/Output Summary (Last 24 hours) at 09/02/2023 1037 Last data filed at 09/01/2023 2145 Gross per 24 hour  Intake 240 ml  Output 1050 ml  Net -810 ml      09/01/2023    5:55 AM 08/02/2023    9:01 AM 07/20/2023   11:26 AM  Last 3 Weights  Weight (lbs) 216 lb 218 lb 9.6 oz 216 lb 9.6 oz  Weight (kg) 97.977 kg 99.156 kg 98.249 kg      Telemetry/ECG    09/02/2023- Personally Reviewed No arrhythmia  Physical Exam .   Physical Exam Vitals and nursing note reviewed.  Constitutional:      General: He is not in acute distress. Neck:     Vascular: No JVD.  Cardiovascular:     Rate and Rhythm: Normal rate and regular rhythm.     Heart sounds: Normal heart sounds. No murmur heard.    Comments: Left groin ecchymosis, no external bleeding Pulmonary:     Effort: Pulmonary effort is normal.     Breath sounds: Normal breath sounds. No wheezing or rales.  Musculoskeletal:     Right lower leg: No edema.     Left lower leg: No edema.      Assessment & Plan .     68 year old male with hypertension, hyperlipidemia, former smoker, PAD s/p PTA with DCB to right SFA 09/01/2023, now with painless active bleeding  PAD: Successful PTA with DCB to right SFA. Ideally, will need to be on dual antiplatelet therapy with  aspirin  and Plavix  for at least 3 months. Overnight rectal bleeding may complicate issues, see below. Continue Lipitor 80, Zetia  10 daily. Continue lisinopril -hydrochlorothiazide .  Painless rectal bleeding: Not witnessed by me, reported by overnight RN. No recent colonoscopy, no prior history of rectal bleeding. Concerning given that patient is now on dual antiplatelet therapy for peripheral intervention 09/01/2023. No hemodynamic compromise.  Rechecking H&H, hemoglobin 1.6 points lower than that noted a month ago. We will get gastroenterology consult as I am concerned the patient may be at risk for recurrent rectal bleeding, with potential for hemodynamic compromise, while on necessary dual antiplatelet therapy. Appreciate gastroenterology input.  CKD: Stable  For questions or updates, please contact [facility] Please consult [email or web address] for contact info under        Signed, Aaron JINNY Lawrence, MD

## 2023-09-02 NOTE — Consult Note (Addendum)
 Referring Provider: Dr. Elmira Primary Care Physician:  G[place]ford Harlene SAILOR, NP Primary Gastroenterologist:  ? [facility] [place]  Reason for Consultation: Rectal bleeding/hematochezia  HPI: Aaron Jimenez is a 68 y.o. male with past medical history of PAD status post PTA with DCB to the right SFA 09/01/2023 on aspirin  and Plavix  as dual antiplatelet therapy for at least 3 months if able.  Had an episode of what was reported as painless rectal bleeding.  Hemoglobin 12.4 g.  GI called to evaluate.  They patient tells me that after his vascular procedure yesterday they had issues with his site in his groin bleeding, needed to apply pressure to it for a long time.  Then later in the day, he says it was still light outside found to have red blood on the pad of his bed.  Reports feeling nothing come from his rectum.  There was no stool with the blood.  It was bright red.  Reports having a formed light brown bowel movement a couple of hours ago with no blood.  He has had colonoscopies regularly, the last about 3 years ago.  He reports that he has had several that have been done at [facility], [facility], and at the [facility].  He recalls last one 3 years ago being fine.  No history of rectal bleeding.   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[facility])     Past Surgical History:  Procedure Laterality Date   ANTERIOR CERVICAL DECOMP/DISCECTOMY FUSION N/A 07/27/2012   Procedure: ANTERIOR CERVICAL DECOMPRESSION/DISCECTOMY FUSION CERVICALSIX-SEVEN1 LEVEL/HARDWARE REMOVAL CERVICAL FIVE-SIX;  Surgeon: Alm GORMAN Molt, MD;  Location: [facility] NEURO ORS;  Service: Neurosurgery;  Laterality: N/A;  Anterior Cervical Decompression/Discectomy Fusion, Cervical Six-Seven Fusion, Removal of hardware at Cervical Five-Six   BACK SURGERY      neck fusion   KNEE ARTHROSCOPY     Left testicle removed  Left    LOWER EXTREMITY ANGIOGRAPHY N/A 09/01/2023   Procedure: Lower Extremity Angiography;  Surgeon: Court Dorn PARAS, MD;  Location: [facility] INVASIVE CV LAB;  Service: Cardiovascular;  Laterality: N/A;   LOWER EXTREMITY INTERVENTION  09/01/2023   Procedure: LOWER EXTREMITY INTERVENTION;  Surgeon: Court Dorn PARAS, MD;  Location: [facility] INVASIVE CV LAB;  Service: Cardiovascular;;   NOSE SURGERY     2020   TOE AMPUTATION 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 Authorizing Provider  aspirin  EC 81 MG tablet Take 1 tablet (81 mg total) by mouth daily. Swallow whole. 11/05/22  Yes Carlin Delon BROCKS, NP  atorvastatin  (LIPITOR) 80 MG tablet Take 1 tablet (80 mg total) by mouth daily. 11/09/22  Yes Carlin Delon BROCKS, NP  ezetimibe  (ZETIA ) 10 MG tablet Take 1 tablet (10 mg total) by mouth daily. 05/02/23 09/01/23 Yes Krasowski, Robert J, MD  FLUoxetine  (PROZAC ) 40 MG capsule Take 80 mg by mouth daily.   Yes [provider]  lisinopril -hydrochlorothiazide  (ZESTORETIC ) 20-12.5 MG tablet Take 1 tablet by mouth daily. 10/14/22  Yes [provider]  naproxen (NAPROSYN) 500 MG tablet Take 500 mg by mouth daily at 12 noon.   Yes [provider]  omeprazole (PRILOSEC) 40 MG capsule Take 40-80 mg by mouth daily as  needed (Acid Reflux).   Yes [provider]  sertraline  (ZOLOFT ) 100 MG tablet Take 100 mg by mouth daily.   Yes [provider]  traMADol  (ULTRAM ) 50 MG tablet Take 50 mg by mouth 4 (four) times daily as needed for pain. 04/02/21  Yes [provider]  ciclopirox  (PENLAC ) 8 % solution Apply topically at bedtime. Apply over nail and surrounding skin. Apply daily over previous coat. Remove weekly with polish remover. Patient not taking: Reported on 08/30/2023 10/20/22   McCaughan, Awanda D, DPM    Current Facility-Administered  Medications  Medication Dose Route Frequency Provider Last Rate Last Admin   0.9 %  sodium chloride  infusion  250 mL Intravenous PRN Court Dorn PARAS, MD       acetaminophen  (TYLENOL ) tablet 650 mg  650 mg Oral Q4H PRN Court Dorn PARAS, MD       aspirin  EC tablet 81 mg  81 mg Oral Daily Court Dorn PARAS, MD   81 mg at 09/02/23 1028   atorvastatin  (LIPITOR) tablet 80 mg  80 mg Oral Daily Court Dorn PARAS, MD   80 mg at 09/02/23 9166   clopidogrel  (PLAVIX ) tablet 75 mg  75 mg Oral Q breakfast Court Dorn PARAS, MD   75 mg at 09/02/23 1028   ezetimibe  (ZETIA ) tablet 10 mg  10 mg Oral Daily Court Dorn PARAS, MD   10 mg at 09/02/23 9167   lisinopril  (ZESTRIL ) tablet 20 mg  20 mg Oral Daily Court Dorn PARAS, MD   20 mg at 09/02/23 9167   And   hydrochlorothiazide  (HYDRODIURIL ) tablet 12.5 mg  12.5 mg Oral Daily Court Dorn PARAS, MD   12.5 mg at 09/02/23 9166   labetalol  (NORMODYNE ) injection 10 mg  10 mg Intravenous Q10 min PRN Court Dorn PARAS, MD       morphine  (PF) 2 MG/ML injection 2 mg  2 mg Intravenous Q1H PRN Court Dorn PARAS, MD       ondansetron  (ZOFRAN ) injection 4 mg  4 mg Intravenous Q6H PRN Court Dorn PARAS, MD       pantoprazole  (PROTONIX ) EC tablet 40 mg  40 mg Oral Daily Court Dorn PARAS, MD   40 mg at 09/02/23 9167   sertraline  (ZOLOFT ) tablet 100 mg  100 mg Oral Daily Court Dorn PARAS, MD   100 mg at 09/02/23 9166   sodium chloride  flush (NS) 0.9 % injection 3 mL  3 mL Intravenous Q12H Court Dorn PARAS, MD   3 mL at 09/02/23 9166   sodium chloride  flush (NS) 0.9 % injection 3 mL  3 mL Intravenous PRN Court Dorn PARAS, MD   3 mL at 09/01/23 2132   traMADol  (ULTRAM ) tablet 50 mg  50 mg Oral QID PRN Court Dorn PARAS, MD        Allergies as of 08/02/2023 - Review Complete 08/02/2023  Allergen Reaction Noted   Bee venom Anaphylaxis 10/10/2014   Mixed ragweed Shortness Of Breath 10/10/2014   Molds & smuts Other (See Comments) 10/10/2014    Family History  Problem  Relation Age of Onset   Heart murmur Mother    Diabetes Father    Cancer Father    Diabetes Paternal Grandfather     Social History   Socioeconomic History   Marital status: Married    Spouse name: Not on file   Number of children: Not on file   Years of education: Not on file   Highest education level: Not on file  Occupational  History   Not on file  Tobacco Use   Smoking status: Former   Smokeless tobacco: Not on file  Vaping Use   Vaping status: Never Used  Substance and Sexual Activity   Alcohol use: Yes    Comment: rare   Drug use: No   Sexual activity: Yes  Other Topics Concern   Not on file  Social History Narrative   Left handed    Lives with wife one story home   Caffeine a pot a day or more.   Welder - pipe fits ( works)   Social Drivers of Corporate investment banker Strain: Not on [employer] Insecurity: No Food Insecurity (09/02/2023)   Hunger Vital Sign    Worried About Running Out of Food in the Last Year: Never true    Ran Out of Food in the Last Year: Never true  Transportation Needs: No Transportation Needs (09/02/2023)   PRAPARE - Administrator, Civil Service (Medical): No    Lack of Transportation (Non-Medical): No  Physical Activity: Not on file  Stress: Not on file  Social Connections: Unknown (09/02/2023)   Social Connection and Isolation Panel    Frequency of Communication with Friends and Family: Never    Frequency of Social Gatherings with Friends and Family: Never    Attends Religious Services: Not on Marketing executive or Organizations: Not on file    Attends Banker Meetings: Not on file    Marital Status: Not on file  Intimate Partner Violence: Not At Risk (09/02/2023)   Humiliation, Afraid, Rape, and Kick questionnaire    Fear of Current or Ex-Partner: No    Emotionally Abused: No    Physically Abused: No    Sexually Abused: No    Review of Systems: ROS is O/W negative except as mentioned  in HPI.  Physical Exam: Vital signs in last 24 hours: Temp:  [97.9 F (36.6 C)-98.5 F (36.9 C)] 97.9 F (36.6 C) (08/15 1149) Pulse Rate:  [52-79] 71 (08/15 1149) Resp:  [10-18] 13 (08/15 1149) BP: (117-184)/(57-92) 117/70 (08/15 1149) SpO2:  [97 %-100 %] 99 % (08/15 1149) Last BM Date : 08/31/23 General:  Alert, well-developed, well-nourished, pleasant and cooperative in NAD Head:  Normocephalic and atraumatic. Eyes:  Sclera clear, no icterus.  Conjunctiva pink. Ears:  Normal auditory acuity. Mouth:  No deformity or lesions.   Lungs:  Clear throughout to auscultation.  No wheezes, crackles, or rhonchi.  Heart:  Regular rate and rhythm; no murmurs, clicks, rubs, or gallops. Abdomen:  Soft, non-distended.  BS present.  Non-tender. Rectal:  No external abnormalities noted.  DRE did not reveal any masses.  Light brown stool on exam glove. Msk:  Symmetrical without gross deformities. Pulses:  Normal pulses noted. Extremities:  Without clubbing or edema. Neurologic:  Alert and oriented x 4;  grossly normal neurologically. Skin:  Intact without significant lesions or rashes. Psych:  Alert and cooperative. Normal mood and affect.  Intake/Output from previous day: 08/14 0701 - 08/15 0700 In: 240 [P.O.:240] Out: 1050 [Urine:1050] Intake/Output this shift: Total I/O In: 240 [P.O.:240] Out: 850 [Urine:850]  Lab Results: Recent Labs    09/02/23 0115 09/02/23 1142  WBC 8.8  --   HGB 11.3* 12.4*  HCT 33.1* 37.1*  PLT 259  --    BMET Recent Labs    09/02/23 0115  NA 138  K 3.6  CL 108  CO2 21*  GLUCOSE  100*  BUN 17  CREATININE 1.32*  CALCIUM  8.5*   Studies/Results: PERIPHERAL VASCULAR CATHETERIZATION Result Date: 09/01/2023 Images from the original result were not included.  [id number] LOCATION:  FACILITY: [facility] PHYSICIAN: Dorn Lesches, M.D. [birth year] DATE OF PROCEDURE:  09/01/2023 DATE OF DISCHARGE: PV Angiogram/Intervention History obtained from chart review.JMARION CHRISTIANO is a 68 y.o.  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 He is accompanied by his wife [place] today. He was referred by Dr. Medford Amis, his primary cardiologist, for evaluation of claudication.  His risk factors include over 50-pack-year tobacco abuse having quit 10 years ago. He has treated hypertension and hyperlipidemia. He has never had a heart attack or stroke. He denies chest pain but does get some dyspnea on exertion. He and his wife unfortunately are not very active because of his claudication. He noticed onset of claudication 3 to 4 months ago which is lifestyle limiting, left greater than right. He did have Doppler studies performed 05/30/2023 revealing right ABI of 0.63, left of 0.71 with what appears to be a high-frequency signal in his distal right common femoral artery and distal right SFA, and principally tibial disease on the left  Since I saw him 2 weeks ago I did begin him on Pletal  which she did not tolerate.  He still complains of bilateral lower extremity lifestyle-limiting claudication and wishes to proceed with outpatient angiography and potential intervention. Pre Procedure Diagnosis: Peripheral arterial disease Post Procedure Diagnosis: Peripheral arterial disease Operators: Dr. Dorn Lesches Procedures Performed:  1.  Ultrasound-guided left common femoral access  2.  Abdominal aortogram/bilateral iliac angiogram/bifemoral angiography  3.  Contralateral access (secondary catheter placement)  4.  Chocolate balloon angioplasty followed by DCB mid right SFA PROCEDURE DESCRIPTION: The patient was brought to the second floor [facility] lab in the the postabsorptive state. He was premedicated with IV Versed  and fentanyl . His left groin was prepped and shaved in usual sterile fashion. Xylocaine  1% was used for local anesthesia. A 5 French sheath was inserted into the left  common femoral artery using standard Seldinger technique.  Ultrasound was used to identify the left common femoral artery and guide access.  Additional images captured and placed the patient chart.  A 5 French catheter was placed in the distal abdominal aorta.  Distal abdominal aortography, bilateral iliac angiography and bilateral lower extremity angiography were performed using Omnipaque  dye (120 cc cc of contrast total palpation).  Retrograde aortic pressures monitored on the case.  Angiographic Data: 1: Abdominal aorta-renal arteries widely patent.  The infrarenal abdominal aorta was tortuous. 2: Left lower extremity-80% proximal and mid/distal left SFA with one-vessel runoff via a diseased peroneal. 3: Right lower extremity-40% proximal right SFA stenosis, 90% mid with one-vessel runoff via an anterior tibial.  The tibioperoneal trunk is occluded and the peroneal reconstitutes.   Mr. Meditz  has bilateral SFA disease as well as tibial disease.  Will proceed with right SFA Will plan angioplasty followed by DCB. Procedure Description: Contralateral access was obtained with a crossover catheter, glide wire, versa core wire and Rosen wire.  I exchanged the 5 French sheath in the left common femoral artery for a 6 French/45 cm catapult sheath.  The patient received 18,000 units of heparin  within an ACT of 245.  Omnipaque  dye was used for the entirety of the case (total contrast the patient 120 cc).  Retrograde aortic pressures  monitored during the case.  I crossed the mid SFA lesion with a 0.18 V 18 wire.  I then dilated with a 5 mm x 4 cm chocolate balloon abdominal pressures for 4 minutes.  On this I performed DCB with a 6 mm x 60 mm Ranger DCB at 4 atm for 4 minutes resulting in reduction of a 90% fairly focal mid right SFA stenosis to less than 20% with a small nonflow limiting linear dissection.  Patient tolerated the procedure well.  The wire was removed.  The conical sheath was removed over the Rosen wire  and exchanged for short 6 French sheath which was then secured in place.  Patient did receive 300 mg of clopidogrel  given the case.  He left from stable condition. Final Impression: Successful right SFA shockwave balloon angioplasty followed by [facility] with one-vessel runoff.  He has residual left SFA disease with one-vessel runoff via a diseased peroneal.  The sheath will be removed once ACT falls below 170 pressure held.  He will be hydrated overnight and discharged home in the morning on DAPT (aspirin  and clopidogrel ).  He will get lower extremity arterial Doppler studies in our [employer] office next week and I will see him back 1 to 2 weeks thereafter.  At that time we will assess clinical benefit and discuss staged left SFA intervention. Dorn Lesches. MD, [facility] 09/01/2023 11:37 AM     IMPRESSION:  Concern for GI bleeding/hematochezia:  Hgb 12.4 grams.  After his vascular procedure yesterday they had issues with his site in his groin bleeding, needed to apply pressure to it for a long time.  Then later in the day, he says it was still light outside found to have red blood on the pad of his bed.  Reports feeling nothing come from his rectum.  There was no stool with the blood.  It was bright red.  On exam today there is no external hemorrhoids.  Light brown stool on the exam glove.  Reports having a formed light brown bowel movement a couple of hours ago with no blood.  He has had colonoscopies regularly, the last about 3 years ago.  He reports that he has had several.  I anticipate that if this were a GI bleeding issue then it was internal hemorrhoid in origin, but I am not convinced that it is a GI bleed and feel that very well could have been from his vascular access site. PAD:  Successful PTA with DCB to right SFA on 8/14.  Ideally, will need to be on dual antiplatelet therapy with aspirin  and Plavix  for at least 3 months.  PLAN: - Suspect that we will just recommend observation for now on his  aspirin  and Plavix . - Could consider hydrocortisone suppositories at bedtime although if this is not recurrent then do not know that it is necessary either.   Harlene BIRCH. Zehr  09/02/2023, 2:05 PM    Attending physician's note   I have taken history, reviewed the chart and examined the patient. I performed a substantive portion of this encounter, including complete performance of at least one of the key components, in conjunction with the APP. I agree with the Advanced Practitioner's note, impression and recommendations.   Rectal exam performed by Harlene Zehr-brown stool.  No obvious bleeding. Also patient without any rectal bleeding.  H/O polyps -had several colonoscopies, last colon 3 years ago. Per pt rpt in 5 yrs. Also negative EGD 3 years ago.  No concerning evidence of rectal bleeding.  He does have internal hemorrhoids.  Can use Preparation H as needed if needed. Otherwise no GI intervention planned [facility] Dr Towana [facility]) has retired  Will sign off for now.    Anselm Bring, MD [facility] [phone]

## 2023-09-05 LAB — POCT ACTIVATED CLOTTING TIME: Activated Clotting Time: 164 s

## 2023-09-06 DIAGNOSIS — I739 Peripheral vascular disease, unspecified: Secondary | ICD-10-CM | POA: Diagnosis not present

## 2023-09-15 ENCOUNTER — Ambulatory Visit (HOSPITAL_BASED_OUTPATIENT_CLINIC_OR_DEPARTMENT_OTHER)
Admission: RE | Admit: 2023-09-15 | Discharge: 2023-09-15 | Disposition: A | Discharge status: Home / Self Care | Source: Ambulatory Visit | Attending: Cardiovascular Disease | Admitting: Cardiovascular Disease

## 2023-09-15 ENCOUNTER — Ambulatory Visit (HOSPITAL_COMMUNITY)
Admission: RE | Admit: 2023-09-15 | Discharge: 2023-09-15 | Disposition: A | Discharge status: Home / Self Care | Source: Ambulatory Visit | Attending: Cardiovascular Disease | Admitting: Cardiovascular Disease

## 2023-09-15 DIAGNOSIS — I739 Peripheral vascular disease, unspecified: Secondary | ICD-10-CM | POA: Insufficient documentation

## 2023-09-15 LAB — VAS US ABI WITH/WO TBI
Left ABI: 0.51
Right ABI: 0.68

## 2023-09-27 ENCOUNTER — Encounter: Payer: Self-pay | Admitting: Cardiovascular Disease

## 2023-09-27 ENCOUNTER — Ambulatory Visit: Attending: Cardiovascular Disease | Admitting: Cardiovascular Disease

## 2023-09-27 VITALS — BP 106/63 | HR 61 | Ht 75.0 in | Wt 217.5 lb

## 2023-09-27 DIAGNOSIS — I739 Peripheral vascular disease, unspecified: Secondary | ICD-10-CM | POA: Diagnosis not present

## 2023-09-27 DIAGNOSIS — R0989 Other specified symptoms and signs involving the circulatory and respiratory systems: Secondary | ICD-10-CM

## 2023-09-27 NOTE — Patient Instructions (Signed)
 Medication Instructions:  Your physician recommends that you continue on your current medications as directed. Please refer to the Current Medication list given to you today.  *If you need a refill on your cardiac medications before your next appointment, please call your pharmacy*  Lab Work: Your physician recommends that you have labs drawn today: BMET & CBC  If you have labs (blood work) drawn today and your tests are completely normal, you will receive your results only by: MyChart Message (if you have MyChart) OR A paper copy in the mail If you have any lab test that is abnormal or we need to change your treatment, we will call you to review the results.  Testing/Procedures: Your physician has requested that you have a lower extremity arterial duplex. During this test, ultrasound is used to evaluate arterial blood flow in the legs. Allow one hour for this exam. There are no restrictions or special instructions. This will take place at [address] 4th floor  **To be done 1-2 weeks after your procedure (10/9)**  Please note: We ask at that you not bring children with you during ultrasound (echo/ vascular) testing. Due to room size and safety concerns, children are not allowed in the ultrasound rooms during exams. Our front office staff cannot provide observation of children in our lobby area while testing is being conducted. An adult accompanying a patient to their appointment will only be allowed in the ultrasound room at the discretion of the ultrasound technician under special circumstances. We apologize for any inconvenience.  Your physician has requested that you have an ankle brachial index (ABI). During this test an ultrasound and blood pressure cuff are used to evaluate the arteries that supply the arms and legs with blood. Allow thirty minutes for this exam. There are no restrictions or special instructions. This will take place at [address], 4th floor  **To be done 1-2  weeks after your procedure (10/9)**   Please note: We ask at that you not bring children with you during ultrasound (echo/ vascular) testing. Due to room size and safety concerns, children are not allowed in the ultrasound rooms during exams. Our front office staff cannot provide observation of children in our lobby area while testing is being conducted. An adult accompanying a patient to their appointment will only be allowed in the ultrasound room at the discretion of the ultrasound technician under special circumstances. We apologize for any inconvenience.   Your physician has requested that you have a carotid duplex. This test is an ultrasound of the carotid arteries in your neck. It looks at blood flow through these arteries that supply the brain with blood. Allow one hour for this exam. There are no restrictions or special instructions. This will take place at [address] 4th floor   **Please schedule with after procedure dopplers**  Please note: We ask at that you not bring children with you during ultrasound (echo/ vascular) testing. Due to room size and safety concerns, children are not allowed in the ultrasound rooms during exams. Our front office staff cannot provide observation of children in our lobby area while testing is being conducted. An adult accompanying a patient to their appointment will only be allowed in the ultrasound room at the discretion of the ultrasound technician under special circumstances. We apologize for any inconvenience.   Follow-Up: At [facility], you and your health needs are our priority.  As part of our continuing mission to provide you with exceptional heart care,  our providers are all part of one team.  This team includes your primary Cardiologist (physician) and Advanced Practice Providers or APPs (Physician Assistants and Nurse Practitioners) who all work together to provide you with the care you need, when you need it.  Your next  appointment:   2-3 week(s) after your procedure (10/9)  Provider:   Dorn Lesches, MD    We recommend signing up for the patient portal called MyChart.  Sign up information is provided on this After Visit Summary.  MyChart is used to connect with patients for Virtual Visits (Telemedicine).  Patients are able to view lab/test results, encounter notes, upcoming appointments, etc.  Non-urgent messages can be sent to your provider as well.   To learn more about what you can do with MyChart, go to [email or web address].   Other Instructions       Cardiac/Peripheral Catheterization   You are scheduled for a Peripheral Angiogram on Thursday, October 9 with Dr. Dorn Lesches.  1. Please arrive at the [facility] (Main Entrance A) at [facility]: [address] [place], [place] [address] at 7:30 AM (This time is 2 hour(s) before your procedure to ensure your preparation).   Free valet parking service is available. You will check in at ADMITTING. The support person will be asked to wait in the waiting room.  It is OK to have someone drop you off and come back when you are ready to be discharged.        Special note: Every effort is made to have your procedure done on time. Please understand that emergencies sometimes delay scheduled procedures.  2. Diet: Nothing to eat after midnight.  3. Hydration:You need to be well hydrated before your procedure. On October 9, you may drink approved liquids (see below) until 2 hours before the procedure, with 16 oz of water as your last intake.   List of approved liquids water, clear juice, clear tea, black coffee, fruit juices, non-citric and without pulp, carbonated beverages, Gatorade, Kool -Aid, plain Jello-O and plain ice popsicles.  4. Labs: You will need to have blood drawn today (9/9).   5. Medication instructions in preparation for your procedure:     Stop taking, lisinopril /hydrochlorothiazide  on Thursday, October  9th      On the morning of your procedure, take Aspirin  81 mg and Plavix /Clopidogrel  and any morning medicines NOT listed above.  You may use sips of water.  6. Plan to go home the same day, you will only stay overnight if medically necessary. 7. You MUST have a responsible adult to drive you home. 8. An adult MUST be with you the first 24 hours after you arrive home. 9. Bring a current list of your medications, and the last time and date medication taken. 10. Bring ID and current insurance cards. 11.Please wear clothes that are easy to get on and off and wear slip-on shoes.  Thank you for allowing us  to care for you!   -- [facility]

## 2023-09-27 NOTE — H&P (View-Only) (Signed)
 09/27/2023 [facility]   [birth year]  [id number]  Primary Physician Glenford Harlene SAILOR, NP Primary Cardiologist: Dorn JINNY Lesches MD [place], [place]  HPI:  Aaron Jimenez is a 68 y.o.  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 He is accompanied by his wife Coolidge today. He was referred by Dr. Bernie  his primary cardiologist, for evaluation of claudication.  His risk factors include over 50-pack-year tobacco abuse having quit 10 years ago. He has treated hypertension and hyperlipidemia. He has never had a heart attack or stroke. He denies chest pain but does get some dyspnea on exertion. He and his wife unfortunately are not very active because of his claudication. He noticed onset of claudication 3 to 4 months ago which is lifestyle limiting, left greater than right. He did have Doppler studies performed 05/30/2023 revealing right ABI of 0.63, left of 0.71 with what appears to be a high-frequency signal in his distal right common femoral artery and distal right SFA, and principally tibial disease on the left   I performed peripheral angiography and intervention on him 09/01/2023 revealing high-grade mid right SFA stenosis with one-vessel runoff.  The anterior tibial artery.  I performed chocolate balloon angioplasty followed by [facility] with excellent result.  His follow-up Dopplers did not show an increase in his right ABI however his velocities did improve as that his claudication.  He did have 80% proximal, mid/distal left SFA disease which was calcified with one-vessel runoff via peroneal artery.  He continues to be symptomatic on the left and wishes to proceed with staged outpatient left SFA intervention.   Current Meds  Medication Sig   aspirin  EC 81 MG tablet Take 1 tablet (81 mg total) by mouth daily. Swallow whole.   atorvastatin  (LIPITOR) 80 MG tablet Take 1 tablet (80 mg  total) by mouth daily.   ciclopirox  (PENLAC ) 8 % solution Apply topically at bedtime. Apply over nail and surrounding skin. Apply daily over previous coat. Remove weekly with polish remover.   clopidogrel  (PLAVIX ) 75 MG tablet Take 1 tablet (75 mg total) by mouth daily with breakfast.   ezetimibe  (ZETIA ) 10 MG tablet Take 1 tablet (10 mg total) by mouth daily.   FLUoxetine  (PROZAC ) 40 MG capsule Take 80 mg by mouth daily.   lisinopril  (ZESTRIL ) 10 MG tablet Take 10 mg by mouth daily.   pantoprazole  (PROTONIX ) 40 MG tablet Take 1 tablet (40 mg total) by mouth daily.   sertraline  (ZOLOFT ) 100 MG tablet Take 100 mg by mouth daily.   traMADol  (ULTRAM ) 50 MG tablet Take 50 mg by mouth 4 (four) times daily as needed for pain.   [DISCONTINUED] lisinopril -hydrochlorothiazide  (ZESTORETIC ) 20-12.5 MG tablet Take 1 tablet by mouth daily.     Allergies  Allergen Reactions   Bee Venom Anaphylaxis   Mixed Ragweed Shortness Of Breath   Molds & Smuts Other (See Comments)    Wheezing    Social History   Socioeconomic History   Marital status: Married    Spouse name: Not on file   Number of children: Not on file   Years of education: Not on file   Highest education level: Not on file  Occupational History   Not on file  Tobacco Use   Smoking status: Former   Smokeless tobacco: Not on file  Vaping Use   Vaping status: Never Used  Substance  and Sexual Activity   Alcohol use: Yes    Comment: rare   Drug use: No   Sexual activity: Yes  Other Topics Concern   Not on file  Social History Narrative   Left handed    Lives with wife one story home   Caffeine a pot a day or more.   Welder - pipe fits ( works)   Social Drivers of Corporate investment banker Strain: Not on [employer] Insecurity: No Food Insecurity (09/02/2023)   Hunger Vital Sign    Worried About Running Out of Food in the Last Year: Never true    Ran Out of Food in the Last Year: Never true  Transportation Needs: No  Transportation Needs (09/02/2023)   PRAPARE - Administrator, Civil Service (Medical): No    Lack of Transportation (Non-Medical): No  Physical Activity: Not on file  Stress: Not on file  Social Connections: Unknown (09/02/2023)   Social Connection and Isolation Panel    Frequency of Communication with Friends and Family: Never    Frequency of Social Gatherings with Friends and Family: Never    Attends Religious Services: Not on Marketing executive or Organizations: Not on file    Attends Banker Meetings: Not on file    Marital Status: Not on file  Intimate Partner Violence: Not At Risk (09/02/2023)   Humiliation, Afraid, Rape, and Kick questionnaire    Fear of Current or Ex-Partner: No    Emotionally Abused: No    Physically Abused: No    Sexually Abused: No     Review of Systems: General: negative for chills, fever, night sweats or weight changes.  Cardiovascular: negative for chest pain, dyspnea on exertion, edema, orthopnea, palpitations, paroxysmal nocturnal dyspnea or shortness of breath Dermatological: negative for rash Respiratory: negative for cough or wheezing Urologic: negative for hematuria Abdominal: negative for nausea, vomiting, diarrhea, bright red blood per rectum, melena, or hematemesis Neurologic: negative for visual changes, syncope, or dizziness All other systems reviewed and are otherwise negative except as noted above.    Blood pressure 106/63, pulse 61, height 6' 3 (1.905 m), weight 217 lb 8 oz (98.7 kg), SpO2 99%.  General appearance: alert and no distress Neck: no adenopathy, no JVD, supple, symmetrical, trachea midline, thyroid not enlarged, symmetric, no tenderness/mass/nodules, and soft bilateral carotid bruits Lungs: clear to auscultation bilaterally Heart: regular rate and rhythm, S1, S2 normal, no murmur, click, rub or gallop Extremities: extremities normal, atraumatic, no cyanosis or edema Pulses: 2+ and  symmetric Skin: Skin color, texture, turgor normal. No rashes or lesions Neurologic: Grossly normal  EKG not performed today      ASSESSMENT AND PLAN:   Claudication in peripheral vascular disease [facility]) Mr. Gervasi returns today for postprocedure follow-up after having undergone right SFA intervention by myself 09/01/2023 for lifestyle-limiting claudication.  He had high-grade mid right SFA stenosis with one-vessel runoff via the anterior tibial.  I performed chocolate balloon angioplasty followed by [facility].  His ABIs did not improve however the velocities in his right SFA did improve as that his claudication.  He has residual disease in his mid and distal left SFA with one-vessel runoff he had a diseased peroneal artery.  He is symptomatic on the left and wishes to proceed with staged outpatient left SFA intervention.     Dorn DOROTHA Lesches MD FACP,FACC,FAHA, [facility] 09/27/2023 11:53 AM

## 2023-09-27 NOTE — Progress Notes (Signed)
 09/27/2023 [facility]   [birth year]  [id number]  Primary Physician Glenford Harlene SAILOR, NP Primary Cardiologist: Dorn JINNY Lesches MD [facility] [place] [place], [place]  HPI:  Aaron Jimenez is a 68 y.o.  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 He is accompanied by his wife Coolidge today. He was referred by Dr. Bernie  his primary cardiologist, for evaluation of claudication.  His risk factors include over 50-pack-year tobacco abuse having quit 10 years ago. He has treated hypertension and hyperlipidemia. He has never had a heart attack or stroke. He denies chest pain but does get some dyspnea on exertion. He and his wife unfortunately are not very active because of his claudication. He noticed onset of claudication 3 to 4 months ago which is lifestyle limiting, left greater than right. He did have Doppler studies performed 05/30/2023 revealing right ABI of 0.63, left of 0.71 with what appears to be a high-frequency signal in his distal right common femoral artery and distal right SFA, and principally tibial disease on the left   I performed peripheral angiography and intervention on him 09/01/2023 revealing high-grade mid right SFA stenosis with one-vessel runoff.  The anterior tibial artery.  I performed chocolate balloon angioplasty followed by [facility] with excellent result.  His follow-up Dopplers did not show an increase in his right ABI however his velocities did improve as that his claudication.  He did have 80% proximal, mid/distal left SFA disease which was calcified with one-vessel runoff via peroneal artery.  He continues to be symptomatic on the left and wishes to proceed with staged outpatient left SFA intervention.   Current Meds  Medication Sig   aspirin  EC 81 MG tablet Take 1 tablet (81 mg total) by mouth daily. Swallow whole.   atorvastatin  (LIPITOR) 80 MG tablet Take 1 tablet (80 mg  total) by mouth daily.   ciclopirox  (PENLAC ) 8 % solution Apply topically at bedtime. Apply over nail and surrounding skin. Apply daily over previous coat. Remove weekly with polish remover.   clopidogrel  (PLAVIX ) 75 MG tablet Take 1 tablet (75 mg total) by mouth daily with breakfast.   ezetimibe  (ZETIA ) 10 MG tablet Take 1 tablet (10 mg total) by mouth daily.   FLUoxetine  (PROZAC ) 40 MG capsule Take 80 mg by mouth daily.   lisinopril  (ZESTRIL ) 10 MG tablet Take 10 mg by mouth daily.   pantoprazole  (PROTONIX ) 40 MG tablet Take 1 tablet (40 mg total) by mouth daily.   sertraline  (ZOLOFT ) 100 MG tablet Take 100 mg by mouth daily.   traMADol  (ULTRAM ) 50 MG tablet Take 50 mg by mouth 4 (four) times daily as needed for pain.   [DISCONTINUED] lisinopril -hydrochlorothiazide  (ZESTORETIC ) 20-12.5 MG tablet Take 1 tablet by mouth daily.     Allergies  Allergen Reactions   Bee Venom Anaphylaxis   Mixed Ragweed Shortness Of Breath   Molds & Smuts Other (See Comments)    Wheezing    Social History   Socioeconomic History   Marital status: Married    Spouse name: Not on file   Number of children: Not on file   Years of education: Not on file   Highest education level: Not on file  Occupational History   Not on file  Tobacco Use   Smoking status: Former   Smokeless tobacco: Not on file  Vaping Use   Vaping status: Never Used  Substance  and Sexual Activity   Alcohol use: Yes    Comment: rare   Drug use: No   Sexual activity: Yes  Other Topics Concern   Not on file  Social History Narrative   Left handed    Lives with wife one story home   Caffeine a pot a day or more.   Welder - pipe fits ( works)   Social Drivers of Corporate investment banker Strain: Not on [employer] Insecurity: No Food Insecurity (09/02/2023)   Hunger Vital Sign    Worried About Running Out of Food in the Last Year: Never true    Ran Out of Food in the Last Year: Never true  Transportation Needs: No  Transportation Needs (09/02/2023)   PRAPARE - Administrator, Civil Service (Medical): No    Lack of Transportation (Non-Medical): No  Physical Activity: Not on file  Stress: Not on file  Social Connections: Unknown (09/02/2023)   Social Connection and Isolation Panel    Frequency of Communication with Friends and Family: Never    Frequency of Social Gatherings with Friends and Family: Never    Attends Religious Services: Not on Marketing executive or Organizations: Not on file    Attends Banker Meetings: Not on file    Marital Status: Not on file  Intimate Partner Violence: Not At Risk (09/02/2023)   Humiliation, Afraid, Rape, and Kick questionnaire    Fear of Current or Ex-Partner: No    Emotionally Abused: No    Physically Abused: No    Sexually Abused: No     Review of Systems: General: negative for chills, fever, night sweats or weight changes.  Cardiovascular: negative for chest pain, dyspnea on exertion, edema, orthopnea, palpitations, paroxysmal nocturnal dyspnea or shortness of breath Dermatological: negative for rash Respiratory: negative for cough or wheezing Urologic: negative for hematuria Abdominal: negative for nausea, vomiting, diarrhea, bright red blood per rectum, melena, or hematemesis Neurologic: negative for visual changes, syncope, or dizziness All other systems reviewed and are otherwise negative except as noted above.    Blood pressure 106/63, pulse 61, height 6' 3 (1.905 m), weight 217 lb 8 oz (98.7 kg), SpO2 99%.  General appearance: alert and no distress Neck: no adenopathy, no JVD, supple, symmetrical, trachea midline, thyroid not enlarged, symmetric, no tenderness/mass/nodules, and soft bilateral carotid bruits Lungs: clear to auscultation bilaterally Heart: regular rate and rhythm, S1, S2 normal, no murmur, click, rub or gallop Extremities: extremities normal, atraumatic, no cyanosis or edema Pulses: 2+ and  symmetric Skin: Skin color, texture, turgor normal. No rashes or lesions Neurologic: Grossly normal  EKG not performed today      ASSESSMENT AND PLAN:   Claudication in peripheral vascular disease [facility]) Mr. Gervasi returns today for postprocedure follow-up after having undergone right SFA intervention by myself 09/01/2023 for lifestyle-limiting claudication.  He had high-grade mid right SFA stenosis with one-vessel runoff via the anterior tibial.  I performed chocolate balloon angioplasty followed by [facility].  His ABIs did not improve however the velocities in his right SFA did improve as that his claudication.  He has residual disease in his mid and distal left SFA with one-vessel runoff he had a diseased peroneal artery.  He is symptomatic on the left and wishes to proceed with staged outpatient left SFA intervention.     Dorn DOROTHA Lesches MD FACP,FACC,FAHA, [facility] 09/27/2023 11:53 AM

## 2023-09-27 NOTE — Assessment & Plan Note (Signed)
 Aaron Jimenez returns today for postprocedure follow-up after having undergone right SFA intervention by myself 09/01/2023 for lifestyle-limiting claudication.  He had high-grade mid right SFA stenosis with one-vessel runoff via the anterior tibial.  I performed chocolate balloon angioplasty followed by [facility].  His ABIs did not improve however the velocities in his right SFA did improve as that his claudication.  He has residual disease in his mid and distal left SFA with one-vessel runoff he had a diseased peroneal artery.  He is symptomatic on the left and wishes to proceed with staged outpatient left SFA intervention.

## 2023-09-28 ENCOUNTER — Ambulatory Visit: Payer: Self-pay | Admitting: Cardiovascular Disease

## 2023-09-28 LAB — BASIC METABOLIC PANEL WITH GFR
BUN/Creatinine Ratio: 15 (ref 10–24)
BUN: 21 mg/dL (ref 8–27)
CO2: 19 mmol/L — ABNORMAL LOW (ref 20–29)
Calcium: 9.1 mg/dL (ref 8.6–10.2)
Chloride: 100 mmol/L (ref 96–106)
Creatinine, Ser: 1.44 mg/dL — ABNORMAL HIGH (ref 0.76–1.27)
Glucose: 98 mg/dL (ref 70–99)
Potassium: 4.5 mmol/L (ref 3.5–5.2)
Sodium: 137 mmol/L (ref 134–144)
eGFR: 53 mL/min/1.73 — ABNORMAL LOW (ref >59)

## 2023-09-28 LAB — CBC
Hematocrit: 32.6 % — ABNORMAL LOW (ref 37.5–51.0)
Hemoglobin: 10.9 g/dL — ABNORMAL LOW (ref 13.0–17.7)
MCH: 30.4 pg (ref 26.6–33.0)
MCHC: 33.4 g/dL (ref 31.5–35.7)
MCV: 91 fL (ref 79–97)
Platelets: 278 x10E3/uL (ref 150–450)
RBC: 3.58 x10E6/uL — ABNORMAL LOW (ref 4.14–5.80)
RDW: 12.6 % (ref 11.6–15.4)
WBC: 8.1 x10E3/uL (ref 3.4–10.8)

## 2023-10-06 ENCOUNTER — Telehealth: Payer: Self-pay | Admitting: Cardiovascular Disease

## 2023-10-06 DIAGNOSIS — I739 Peripheral vascular disease, unspecified: Secondary | ICD-10-CM | POA: Diagnosis not present

## 2023-10-06 DIAGNOSIS — E663 Overweight: Secondary | ICD-10-CM | POA: Diagnosis not present

## 2023-10-06 DIAGNOSIS — I10 Essential (primary) hypertension: Secondary | ICD-10-CM | POA: Diagnosis not present

## 2023-10-06 DIAGNOSIS — E7800 Pure hypercholesterolemia, unspecified: Secondary | ICD-10-CM | POA: Diagnosis not present

## 2023-10-06 DIAGNOSIS — K219 Gastro-esophageal reflux disease without esophagitis: Secondary | ICD-10-CM | POA: Diagnosis not present

## 2023-10-06 DIAGNOSIS — Z6827 Body mass index (BMI) 27.0-27.9, adult: Secondary | ICD-10-CM | POA: Diagnosis not present

## 2023-10-06 DIAGNOSIS — G2581 Restless legs syndrome: Secondary | ICD-10-CM | POA: Diagnosis not present

## 2023-10-06 DIAGNOSIS — F321 Major depressive disorder, single episode, moderate: Secondary | ICD-10-CM | POA: Diagnosis not present

## 2023-10-06 NOTE — Telephone Encounter (Signed)
 Spoke with Aaron Jimenez at [facility] regarding pt's labs. Latest labs were faxed.

## 2023-10-06 NOTE — Telephone Encounter (Signed)
 Caller Bobbetta) in patient's PCP office wants to get a copy of patient's most recent labs. Fax# [phone].

## 2023-10-25 ENCOUNTER — Ambulatory Visit: Admitting: Cardiovascular Disease

## 2023-10-25 ENCOUNTER — Telehealth: Payer: Self-pay | Admitting: *Deleted

## 2023-10-25 NOTE — Telephone Encounter (Signed)
 LEA scheduled at [facility] for: Thursday October 27, 2023 9:30 AM Arrival time [facility] Main Entrance A at: 7:30 AM  Diet: -Nothing to eat after midnight.  Hydration: -May drink clear liquids until 2 hours before the procedure.  Approved liquids: Water, clear tea, black coffee, fruit juices-non-citric and without pulp,Gatorade, plain Jello/popsicles.   -Please drink 16 oz of water 2 hours before procedure and extra bottle of water.   Medication instructions: -Usual morning medications can be taken including aspirin  81 mg and Plavix  75 mg.  Plan to go home the same day, you will only stay overnight if medically necessary.  You must have responsible adult to drive you home.  Someone must be with you the first 24 hours after you arrive home.   Reviewed procedure instructions with patient.

## 2023-10-27 ENCOUNTER — Ambulatory Visit (HOSPITAL_COMMUNITY)
Admission: RE | Admit: 2023-10-27 | Discharge: 2023-10-28 | Disposition: A | Discharge status: Home / Self Care | Attending: Cardiovascular Disease | Admitting: Cardiovascular Disease

## 2023-10-27 ENCOUNTER — Encounter (HOSPITAL_COMMUNITY)
Admission: RE | Disposition: A | Discharge status: Home / Self Care | Payer: Self-pay | Source: Home / Self Care | Attending: Cardiovascular Disease

## 2023-10-27 DIAGNOSIS — I739 Peripheral vascular disease, unspecified: Secondary | ICD-10-CM | POA: Diagnosis present

## 2023-10-27 DIAGNOSIS — Z87891 Personal history of nicotine dependence: Secondary | ICD-10-CM | POA: Diagnosis not present

## 2023-10-27 DIAGNOSIS — E785 Hyperlipidemia, unspecified: Secondary | ICD-10-CM | POA: Diagnosis not present

## 2023-10-27 DIAGNOSIS — I10 Essential (primary) hypertension: Secondary | ICD-10-CM | POA: Diagnosis not present

## 2023-10-27 DIAGNOSIS — I70212 Atherosclerosis of native arteries of extremities with intermittent claudication, left leg: Secondary | ICD-10-CM | POA: Diagnosis not present

## 2023-10-27 HISTORY — PX: LOWER EXTREMITY ANGIOGRAPHY: CATH118251

## 2023-10-27 HISTORY — PX: LOWER EXTREMITY INTERVENTION: CATH118252

## 2023-10-27 LAB — POCT ACTIVATED CLOTTING TIME
Activated Clotting Time: 233 s
Activated Clotting Time: 251 s
Activated Clotting Time: 193 s
Activated Clotting Time: 176 s

## 2023-10-27 MED ORDER — EZETIMIBE 10 MG PO TABS
10.0000 mg | ORAL_TABLET | Freq: Every day | ORAL | Status: DC
  Administered 2023-10-27 – 2023-10-28 (×2): 10 mg via ORAL
  Filled 2023-10-27 (×2): qty 1

## 2023-10-27 MED ORDER — SODIUM CHLORIDE 0.9 % IV SOLN: 250.0000 mL | INTRAVENOUS | Status: DC | PRN

## 2023-10-27 MED ORDER — ONDANSETRON HCL 4 MG/2ML IJ SOLN: 4.0000 mg | Freq: Four times a day (QID) | INTRAMUSCULAR | Status: DC | PRN

## 2023-10-27 MED ORDER — SODIUM CHLORIDE 0.9% FLUSH: 3.0000 mL | Freq: Two times a day (BID) | INTRAVENOUS | Status: DC

## 2023-10-27 MED ORDER — LABETALOL HCL 5 MG/ML IV SOLN: 10.0000 mg | INTRAVENOUS | Status: DC | PRN

## 2023-10-27 MED ORDER — IODIXANOL 320 MG/ML IV SOLN
INTRAVENOUS | Status: DC | PRN
  Administered 2023-10-27: 45 mL via INTRA_ARTERIAL

## 2023-10-27 MED ORDER — CILOSTAZOL 50 MG PO TABS
50.0000 mg | ORAL_TABLET | Freq: Every morning | ORAL | Status: DC
  Administered 2023-10-28: 50 mg via ORAL
  Filled 2023-10-27: qty 1

## 2023-10-27 MED ORDER — LISINOPRIL 10 MG PO TABS
10.0000 mg | ORAL_TABLET | Freq: Every day | ORAL | Status: DC
  Administered 2023-10-27 – 2023-10-28 (×2): 10 mg via ORAL
  Filled 2023-10-27 (×2): qty 1

## 2023-10-27 MED ORDER — ASPIRIN 81 MG PO TBEC
81.0000 mg | DELAYED_RELEASE_TABLET | Freq: Every day | ORAL | Status: DC
  Administered 2023-10-28: 81 mg via ORAL
  Filled 2023-10-27: qty 1

## 2023-10-27 MED ORDER — HYDRALAZINE HCL 20 MG/ML IJ SOLN: 5.0000 mg | INTRAMUSCULAR | Status: DC | PRN

## 2023-10-27 MED ORDER — ASPIRIN 81 MG PO CHEW: 81.0000 mg | CHEWABLE_TABLET | ORAL | Status: DC

## 2023-10-27 MED ORDER — HYDRALAZINE HCL 20 MG/ML IJ SOLN
INTRAMUSCULAR | Status: DC | PRN
  Administered 2023-10-27: 10 mg via INTRAVENOUS

## 2023-10-27 MED ORDER — SODIUM CHLORIDE 0.9% FLUSH: 3.0000 mL | INTRAVENOUS | Status: DC | PRN

## 2023-10-27 MED ORDER — PANTOPRAZOLE SODIUM 40 MG PO TBEC
40.0000 mg | DELAYED_RELEASE_TABLET | Freq: Every day | ORAL | Status: DC
  Administered 2023-10-27 – 2023-10-28 (×2): 40 mg via ORAL
  Filled 2023-10-27 (×2): qty 1

## 2023-10-27 MED ORDER — CLOPIDOGREL BISULFATE 75 MG PO TABS: 75.0000 mg | ORAL_TABLET | ORAL | Status: DC

## 2023-10-27 MED ORDER — SODIUM CHLORIDE 0.9% FLUSH
3.0000 mL | Freq: Two times a day (BID) | INTRAVENOUS | Status: DC
  Administered 2023-10-27 – 2023-10-28 (×2): 3 mL via INTRAVENOUS

## 2023-10-27 MED ORDER — LIDOCAINE HCL (PF) 1 % IJ SOLN
INTRAMUSCULAR | Status: AC
  Filled 2023-10-27: qty 30

## 2023-10-27 MED ORDER — HEPARIN SODIUM (PORCINE) 1000 UNIT/ML IJ SOLN
INTRAMUSCULAR | Status: DC | PRN
  Administered 2023-10-27: 10000 [IU] via INTRAVENOUS
  Administered 2023-10-27: 3000 [IU] via INTRAVENOUS

## 2023-10-27 MED ORDER — LIDOCAINE HCL (PF) 1 % IJ SOLN
INTRAMUSCULAR | Status: DC | PRN
  Administered 2023-10-27: 25 mL

## 2023-10-27 MED ORDER — SODIUM CHLORIDE 0.9 % IV SOLN: INTRAVENOUS | Status: AC

## 2023-10-27 MED ORDER — ASPIRIN 81 MG PO TBEC: 81.0000 mg | DELAYED_RELEASE_TABLET | Freq: Every day | ORAL | Status: DC

## 2023-10-27 MED ORDER — ATORVASTATIN CALCIUM 80 MG PO TABS
80.0000 mg | ORAL_TABLET | Freq: Every day | ORAL | Status: DC
  Administered 2023-10-27 – 2023-10-28 (×2): 80 mg via ORAL
  Filled 2023-10-27 (×2): qty 1

## 2023-10-27 MED ORDER — CLOPIDOGREL BISULFATE 75 MG PO TABS
75.0000 mg | ORAL_TABLET | Freq: Every day | ORAL | Status: DC
  Administered 2023-10-28: 75 mg via ORAL
  Filled 2023-10-27: qty 1

## 2023-10-27 MED ORDER — HYDRALAZINE HCL 20 MG/ML IJ SOLN
INTRAMUSCULAR | Status: AC
  Filled 2023-10-27: qty 1

## 2023-10-27 MED ORDER — ACETAMINOPHEN 325 MG PO TABS: 650.0000 mg | ORAL_TABLET | ORAL | Status: DC | PRN

## 2023-10-27 MED ORDER — HEPARIN (PORCINE) IN NACL 2000-0.9 UNIT/L-% IV SOLN
INTRAVENOUS | Status: DC | PRN
  Administered 2023-10-27: 1000 mL

## 2023-10-27 MED ORDER — CLOPIDOGREL BISULFATE 75 MG PO TABS: 75.0000 mg | ORAL_TABLET | Freq: Every day | ORAL | Status: DC

## 2023-10-27 MED ORDER — FREE WATER: 500.0000 mL | Freq: Once | Status: DC

## 2023-10-27 NOTE — Interval H&P Note (Signed)
 History and Physical Interval Note:  10/27/2023 12:59 PM  Aaron Jimenez [facility]  has presented today for surgery, with the diagnosis of pad.  The various methods of treatment have been discussed with the patient and family. After consideration of risks, benefits and other options for treatment, the patient has consented to  Procedure(s): Lower Extremity Angiography (N/A) as a surgical intervention.  The patient's history has been reviewed, patient examined, no change in status, stable for surgery.  I have reviewed the patient's chart and labs.  Questions were answered to the patient's satisfaction.     Dorn Lesches

## 2023-10-27 NOTE — Progress Notes (Signed)
 Site area: R fem artery 76fr sheath Site Prior to Removal:  Level 0 Pressure Applied For: Manual:  yes  Patient Status During Pull:  stable Post Pull Site:  Level 0 Post Pull Instructions Given:  yes with teach back  Post Pull Pulses Present: +1 PT/DP Dressing Applied:  gauze and tegaderm Bedrest begins @ 1800 Comments: Bedrest for 6hrs

## 2023-10-27 NOTE — Plan of Care (Signed)

## 2023-10-28 ENCOUNTER — Encounter (HOSPITAL_COMMUNITY): Payer: Self-pay | Admitting: Cardiovascular Disease

## 2023-10-28 DIAGNOSIS — I70212 Atherosclerosis of native arteries of extremities with intermittent claudication, left leg: Secondary | ICD-10-CM | POA: Diagnosis not present

## 2023-10-28 DIAGNOSIS — Z87891 Personal history of nicotine dependence: Secondary | ICD-10-CM | POA: Diagnosis not present

## 2023-10-28 DIAGNOSIS — I10 Essential (primary) hypertension: Secondary | ICD-10-CM | POA: Diagnosis not present

## 2023-10-28 DIAGNOSIS — I739 Peripheral vascular disease, unspecified: Secondary | ICD-10-CM

## 2023-10-28 DIAGNOSIS — E785 Hyperlipidemia, unspecified: Secondary | ICD-10-CM | POA: Diagnosis not present

## 2023-10-28 LAB — BASIC METABOLIC PANEL WITH GFR
Anion gap: 11 (ref 5–15)
BUN: 15 mg/dL (ref 8–23)
CO2: 20 mmol/L — ABNORMAL LOW (ref 22–32)
Calcium: 8.3 mg/dL — ABNORMAL LOW (ref 8.9–10.3)
Chloride: 107 mmol/L (ref 98–111)
Creatinine, Ser: 1.42 mg/dL — ABNORMAL HIGH (ref 0.61–1.24)
GFR, Estimated: 53 mL/min — ABNORMAL LOW (ref >60)
Glucose, Bld: 121 mg/dL — ABNORMAL HIGH (ref 70–99)
Potassium: 4.2 mmol/L (ref 3.5–5.1)
Sodium: 138 mmol/L (ref 135–145)

## 2023-10-28 LAB — CBC
HCT: 32.2 % — ABNORMAL LOW (ref 39.0–52.0)
Hemoglobin: 11 g/dL — ABNORMAL LOW (ref 13.0–17.0)
MCH: 30.2 pg (ref 26.0–34.0)
MCHC: 34.2 g/dL (ref 30.0–36.0)
MCV: 88.5 fL (ref 80.0–100.0)
Platelets: 341 K/uL (ref 150–400)
RBC: 3.64 MIL/uL — ABNORMAL LOW (ref 4.22–5.81)
RDW: 12.3 % (ref 11.5–15.5)
WBC: 9.3 K/uL (ref 4.0–10.5)
nRBC: 0 % (ref 0.0–0.2)

## 2023-10-28 LAB — LIPID PANEL
Cholesterol: 112 mg/dL (ref 0–200)
HDL: 39 mg/dL — ABNORMAL LOW (ref >40)
LDL Cholesterol: 50 mg/dL (ref 0–99)
Total CHOL/HDL Ratio: 2.9 ratio
Triglycerides: 117 mg/dL (ref <150)
VLDL: 23 mg/dL (ref 0–40)

## 2023-10-28 NOTE — Progress Notes (Signed)
 PHARMACIST LIPID MONITORING   Aaron Jimenez is a 68 y.o. male admitted on 10/27/2023 with claudication in peripheral vascular disease.  Pharmacy has been consulted to optimize lipid-lowering therapy with the indication of secondary prevention for clinical ASCVD.  Recent Labs:  Lipid Panel (last 6 months):   Lab Results  Component Value Date   CHOL 112 10/28/2023   TRIG 117 10/28/2023   HDL 39 (L) 10/28/2023   CHOLHDL 2.9 10/28/2023   VLDL 23 10/28/2023   LDLCALC 50 10/28/2023    Hepatic function panel (last 6 months):   No results found for: AST, ALT, ALKPHOS, BILITOT, BILIDIR, IBILI  SCr (since admission):   Serum creatinine: 1.42 mg/dL (H) 89/89/74 9663 Estimated creatinine clearance: 57.9 mL/min (A)  Current therapy and lipid therapy tolerance Current lipid-lowering therapy: atorvastatin  80 mg, ezetimibe  Previous lipid-lowering therapies (if applicable): n/a Documented or reported allergies or intolerances to lipid-lowering therapies (if applicable): n/a  Assessment:   Patient currently with LDL at goal of < 55 mg/dL. No changes to be made at this time.  Plan:    1.Statin intensity (high intensity recommended for all patients regardless of the LDL):  No statin changes. The patient is already on a high intensity statin.  2.Add ezetimibe : Already on ezetimibe  therapy  3.Refer to lipid clinic:   No  4.Follow-up with:  Cardiology provider - Dorn Lesches, MD  5.Follow-up labs after discharge:  No changes in lipid therapy, repeat a lipid panel in one year.       Thank you for allowing pharmacy to be a part of this patient's care.   Nidia Schaffer, PharmD PGY2 Cardiology Pharmacy Resident  Please check AMION for all [facility] phone numbers After 10:00 PM, call Main Pharmacy [phone] 10/28/2023, 8:59 AM

## 2023-10-28 NOTE — Discharge Summary (Signed)
 Discharge Summary   Patient ID: Aaron Jimenez MRN: [id number]; DOB: 1955-04-20  Admit date: 10/27/2023 Discharge date: 10/28/2023  PCP:  G[place] Harlene SAILOR, NP   [facility] Providers Cardiologist:  Dorn Lesches, MD    Discharge Diagnoses  Principal Problem:   Claudication in peripheral vascular disease  Diagnostic Studies/Procedures   PV angiogram: 10/27/2023  Procedures Performed:             1.  Ultrasound-guided right common femoral access             2.  Contralateral access (secondary catheter placement)             3.  Chocolate balloon angioplasty left proximal and mid SFA             4.  DCB left proximal and mid SFA   PROCEDURE DESCRIPTION:    The patient was brought to the second floor [facility] cath lab in the the postabsorptive state. He was not premedicated . His right groin was prepped and shaved in usual sterile fashion. Xylocaine  1% was used for local anesthesia. A 5 French sheath was inserted into the right common femoral artery using standard Seldinger technique.  Ultrasound was used to identify the right common femoral artery and guide access.  Additional images captured and placed the patient's chart.  Contralateral access was obtained with a crossover catheter, with 359 Glidewire, a 3 5 Nava cross 60 cm long endhole catheter, 035 Rosen wire and a 6 French 45 cm long Ansell sheath.  On the pigtails used for the entirety of the case (45 cc contrast total to patient).  Retrograde pressures monitored in the case.  The patient did receive 13,000's of heparin  intravenously with an ACT of 251.  Final Impression: Successful chocolate balloon angioplasty followed by DCB of significant left SFA proximal and mid lesion.  The sheath will be removed once ACT falls below 170 and pressure held.  Patient was already on aspirin  and clopidogrel .  He will be hydrated overnight and discharged home in the morning.  Will get lower extremity arterial Doppler studies in  our [employer] office next week and I will see him back in the office 1 to 2 weeks thereafter.  He left the lab in stable condition.   Dorn Lesches. MD, [facility] 10/27/2023 2:41 PM   _____________   History of Present Illness   Aaron Jimenez is a 68 y.o. male with past medical history of hypertension, hyperlipidemia who was referred to Dr. Wadie for lifestyle-limiting claudication.  He underwent outpatient lower extremity Dopplers that showed an ABI on the right of 0.63, left of 0.71 with high frequency signal in the distal right common femoral artery and distal right SFA.  Underwent outpatient peripheral angiography and intervention 08/2023 which showed high-grade mid right SFA cyst stenosis with one-vessel runoff treated with chocolate balloon angioplasty followed by drug-coated balloon.  He had an 80% proximal stenosis, mid/distal left SFA disease treated medically.  Seen back in the office on 9/9 and reported symptoms of claudication at that time therefore he was brought back for outpatient PV angiography with plans for left SFA intervention.   Hospital Course    He presented for outpatient PV angiography and underwent successful chocolate coated balloon angioplasty followed by drug-coated balloon with good result.  He was already on aspirin /Plavix  prior to admission.  He was monitored overnight without complication.  Able to ambulate the following morning.  General: Well developed, well nourished, male  appearing in no acute distress. Head: Normocephalic, atraumatic.  Neck: Supple without bruits, JVD. Lungs:  Resp regular and unlabored, CTA. Heart: RRR, S1, S2, no S3, S4, or murmur; no rub. Abdomen: Soft, non-tender, non-distended with normoactive bowel sounds.  Extremities: No clubbing, cyanosis, edema. Distal pedal pulses are 2+ bilaterally. Right cath site stable without bruising or hematoma Neuro: Alert and oriented X 3. Moves all extremities spontaneously. Psych: Normal  affect.   Did the patient have an acute coronary syndrome (MI, NSTEMI, STEMI, etc) this admission?:  No                               Did the patient have a percutaneous coronary intervention (stent / angioplasty)?:  No.   _____________  Discharge Vitals Blood pressure (!) 146/77, pulse 60, temperature 98.2 F (36.8 C), temperature source Oral, resp. rate 16, height 6' 3 (1.905 m), weight 96.2 kg, SpO2 100%.  Filed Weights   10/27/23 0811  Weight: 96.2 kg    Labs & Radiologic Studies  CBC Recent Labs    10/28/23 0336  WBC 9.3  HGB 11.0*  HCT 32.2*  MCV 88.5  PLT 341   Basic Metabolic Panel Recent Labs    89/89/74 0336  NA 138  K 4.2  CL 107  CO2 20*  GLUCOSE 121*  BUN 15  CREATININE 1.42*  CALCIUM  8.3*   Liver Function Tests No results for input(s): AST, ALT, ALKPHOS, BILITOT, PROT, ALBUMIN in the last 72 hours. No results for input(s): LIPASE, AMYLASE in the last 72 hours. High Sensitivity Troponin:   No results for input(s): TROPONINIHS in the last 720 hours.  No results for input(s): TRNPT in the last 720 hours.  BNP Invalid input(s): POCBNP No results for input(s): PROBNP in the last 72 hours.  No results for input(s): BNP in the last 72 hours.  D-Dimer No results for input(s): DDIMER in the last 72 hours. Hemoglobin A1C No results for input(s): HGBA1C in the last 72 hours. Fasting Lipid Panel Recent Labs    10/28/23 0336  CHOL 112  HDL 39*  LDLCALC 50  TRIG 882  CHOLHDL 2.9   No results found for: LIPOA  Thyroid Function Tests No results for input(s): TSH, T4TOTAL, T3FREE, THYROIDAB in the last 72 hours.  Invalid input(s): FREET3 _____________  PERIPHERAL VASCULAR CATHETERIZATION Result Date: 10/27/2023 Images from the original result were not included.  [id number] LOCATION:  FACILITY: [facility] PHYSICIAN: Dorn Lesches, M.D. [birth year] DATE OF PROCEDURE:  10/27/2023 DATE OF DISCHARGE: PV  Angiogram/Intervention History obtained from from patient's chart- Aaron Jimenez is a 67 y.o.  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 He is accompanied by his wife [place] today. He was referred by Dr. Bernie  his primary cardiologist, for evaluation of claudication.  His risk factors include over 50-pack-year tobacco abuse having quit 10 years ago. He has treated hypertension and hyperlipidemia. He has never had a heart attack or stroke. He denies chest pain but does get some dyspnea on exertion. He and his wife unfortunately are not very active because of his claudication. He noticed onset of claudication 3 to 4 months ago which is lifestyle limiting, left greater than right. He did have Doppler studies performed 05/30/2023 revealing right ABI of 0.63, left of 0.71 with what appears to be a high-frequency signal in  his distal right common femoral artery and distal right SFA, and principally tibial disease on the left  I performed peripheral angiography and intervention on him 09/01/2023 revealing high-grade mid right SFA stenosis with one-vessel runoff.  The anterior tibial artery.  I performed chocolate balloon angioplasty followed by [facility] with excellent result.  His follow-up Dopplers did not show an increase in his right ABI however his velocities did improve as that his claudication.  He did have 80% proximal, mid/distal left SFA disease which was calcified with one-vessel runoff via peroneal artery.  He continues to be symptomatic on the left and wishes to proceed with staged outpatient left SFA intervention.. Pre Procedure Diagnosis: Peripheral arterial disease Post Procedure Diagnosis: Peripheral arterial disease Operators: Dr. Dorn Lesches Procedures Performed:  1.  Ultrasound-guided right common femoral access  2.  Contralateral access (secondary catheter placement)  3.  Chocolate balloon  angioplasty left proximal and mid SFA  4.  DCB left proximal and mid SFA PROCEDURE DESCRIPTION: The patient was brought to the second floor [facility] cath lab in the the postabsorptive state. He was not premedicated . His right groin was prepped and shaved in usual sterile fashion. Xylocaine  1% was used for local anesthesia. A 5 French sheath was inserted into the right common femoral artery using standard Seldinger technique.  Ultrasound was used to identify the right common femoral artery and guide access.  Additional images captured and placed the patient's chart.  Contralateral access was obtained with a crossover catheter, with 359 Glidewire, a 3 5 Nava cross 60 cm long endhole catheter, 035 Rosen wire and a 6 French 45 cm long Ansell sheath.  On the pigtails used for the entirety of the case (45 cc contrast total to patient).  Retrograde pressures monitored in the case.  The patient did receive 13,000's of heparin  intravenously with an ACT of 251. Procedure Description: Patient had diagnostic angiogram performed within the last several weeks so decision was to go directly to intervention.  I crossed both lesions within 0.146 g shepherd wire.  I then performed [place] Balloon angioplasty with a 5 mm x 4 cm chocolate balloon on both proximal and mid lesions followed by [facility] using a 6 mm x 60 mm long Ranger DCB at low atmospheres for 3 minutes in the mid lesion and a 6 mm x 40 mm long Ranger DCB for 4 atm at 3 minutes resulting in reduction of a 70% proximal lesion to 0% residual and 80% mid lesion less than 20% with a small linear nonflow-limiting dissection.  The patient tolerated the procedure well.  The Ansell sheath was withdrawn across the bifurcation and exchanged for a short 6 [place] sheath.  A groin shot was performed with the intent to performed Mynx closure however there was a loop in the common femoral artery just distal to the sheath tip making the procedure somewhat high risk in this  decision was then aborted and the patient will have a manual hold. Final Impression: Successful chocolate balloon angioplasty followed by DCB of significant left SFA proximal and mid lesion.  The sheath will be removed once ACT falls below 170 and pressure held.  Patient was already on aspirin  and clopidogrel .  He will be hydrated overnight and discharged home in the morning.  Will get lower extremity arterial Doppler studies in our [employer] office next week and I will see him back in the office 1 to 2 weeks thereafter.  He left the lab in stable condition. Dorn  Orma MD, [facility] 10/27/2023 2:41 PM     Disposition Pt is being discharged home today in good condition.  Follow-up Plans & Appointments  Discharge Instructions     Call MD for:  difficulty breathing, headache or visual disturbances   Complete by: As directed    Call MD for:  persistant dizziness or light-headedness   Complete by: As directed    Call MD for:  redness, tenderness, or signs of infection (pain, swelling, redness, odor or green/yellow discharge around incision site)   Complete by: As directed    Diet - low sodium heart healthy   Complete by: As directed    Discharge instructions   Complete by: As directed    Groin Site Care Refer to this sheet in the next few weeks. These instructions provide you with information on caring for yourself after your procedure. Your caregiver may also give you more specific instructions. Your treatment has been planned according to current medical practices, but problems sometimes occur. Call your caregiver if you have any problems or questions after your procedure. HOME CARE INSTRUCTIONS You may shower 24 hours after the procedure. Remove the bandage (dressing) and gently wash the site with plain soap and water. Gently pat the site dry.  Do not apply powder or lotion to the site.  Do not sit in a bathtub, swimming pool, or whirlpool for 5 to 7 days.  No bending, squatting, or  lifting anything over 10 pounds (4.5 kg) as directed by your caregiver.  Inspect the site at least twice daily.  Do not drive home if you are discharged the same day of the procedure. Have someone else drive you.  You may drive 24 hours after the procedure unless otherwise instructed by your caregiver.  What to expect: Any bruising will usually fade within 1 to 2 weeks.  Blood that collects in the tissue (hematoma) may be painful to the touch. It should usually decrease in size and tenderness within 1 to 2 weeks.  SEEK IMMEDIATE MEDICAL CARE IF: You have unusual pain at the groin site or down the affected leg.  You have redness, warmth, swelling, or pain at the groin site.  You have drainage (other than a small amount of blood on the dressing).  You have chills.  You have a fever or persistent symptoms for more than 72 hours.  You have a fever and your symptoms suddenly get worse.  Your leg becomes pale, cool, tingly, or numb.  You have heavy bleeding from the site. Hold pressure on the site. .   Increase activity slowly   Complete by: As directed        Discharge Medications Allergies as of 10/28/2023       Reactions   Bee Venom Anaphylaxis   Mixed Ragweed Shortness Of Breath        Medication List     STOP taking these medications    cilostazol  50 MG tablet Commonly known as: PLETAL        TAKE these medications    aspirin  EC 81 MG tablet Take 1 tablet (81 mg total) by mouth daily. Swallow whole.   atorvastatin  80 MG tablet Commonly known as: LIPITOR Take 1 tablet (80 mg total) by mouth daily.   clopidogrel  75 MG tablet Commonly known as: PLAVIX  Take 1 tablet (75 mg total) by mouth daily with breakfast.   ezetimibe  10 MG tablet Commonly known as: ZETIA  Take 1 tablet (10 mg total) by mouth daily. What changed: when to  take this   FLUoxetine  40 MG capsule Commonly known as: PROZAC  Take 80 mg by mouth in the morning.   lisinopril  10 MG tablet Commonly  known as: ZESTRIL  Take 10 mg by mouth daily.   pantoprazole  40 MG tablet Commonly known as: PROTONIX  Take 1 tablet (40 mg total) by mouth daily.   sertraline  100 MG tablet Commonly known as: ZOLOFT  Take 100 mg by mouth in the morning and at bedtime.   traMADol  50 MG tablet Commonly known as: ULTRAM  Take 50 mg by mouth 4 (four) times daily as needed for pain.         Outstanding Labs/Studies  LE dopplers (arranged)   Duration of Discharge Encounter: APP Time: 20 minutes   Signed, Manuelita Rummer, NP 10/28/2023, 11:13 AM

## 2023-11-02 DIAGNOSIS — I739 Peripheral vascular disease, unspecified: Secondary | ICD-10-CM | POA: Diagnosis not present

## 2023-11-10 ENCOUNTER — Ambulatory Visit (INDEPENDENT_AMBULATORY_CARE_PROVIDER_SITE_OTHER)

## 2023-11-10 ENCOUNTER — Ambulatory Visit: Attending: Cardiovascular Disease

## 2023-11-10 DIAGNOSIS — I739 Peripheral vascular disease, unspecified: Secondary | ICD-10-CM

## 2023-11-10 DIAGNOSIS — R0989 Other specified symptoms and signs involving the circulatory and respiratory systems: Secondary | ICD-10-CM | POA: Diagnosis not present

## 2023-11-10 LAB — VAS US ABI WITH/WO TBI
Left ABI: 0.79
Right ABI: 0.91

## 2023-11-18 DIAGNOSIS — I10 Essential (primary) hypertension: Secondary | ICD-10-CM | POA: Diagnosis not present

## 2023-11-18 DIAGNOSIS — Z79899 Other long term (current) drug therapy: Secondary | ICD-10-CM | POA: Diagnosis not present

## 2023-11-18 DIAGNOSIS — E7800 Pure hypercholesterolemia, unspecified: Secondary | ICD-10-CM | POA: Diagnosis not present

## 2023-11-18 DIAGNOSIS — K219 Gastro-esophageal reflux disease without esophagitis: Secondary | ICD-10-CM | POA: Diagnosis not present

## 2023-11-18 DIAGNOSIS — F321 Major depressive disorder, single episode, moderate: Secondary | ICD-10-CM | POA: Diagnosis not present

## 2023-11-21 ENCOUNTER — Ambulatory Visit: Admitting: Cardiovascular Disease

## 2023-11-24 ENCOUNTER — Ambulatory Visit: Attending: Cardiovascular Disease | Admitting: Cardiovascular Disease

## 2023-11-24 ENCOUNTER — Encounter: Payer: Self-pay | Admitting: Cardiovascular Disease

## 2023-11-24 VITALS — BP 137/67 | HR 58 | Ht 75.0 in | Wt 205.4 lb

## 2023-11-24 DIAGNOSIS — I739 Peripheral vascular disease, unspecified: Secondary | ICD-10-CM | POA: Diagnosis not present

## 2023-11-24 DIAGNOSIS — I6522 Occlusion and stenosis of left carotid artery: Secondary | ICD-10-CM

## 2023-11-24 DIAGNOSIS — I779 Disorder of arteries and arterioles, unspecified: Secondary | ICD-10-CM

## 2023-11-24 HISTORY — DX: Disorder of arteries and arterioles, unspecified: I77.9

## 2023-11-24 NOTE — Patient Instructions (Signed)
 Medication Instructions:  Your physician recommends that you continue on your current medications as directed. Please refer to the Current Medication list given to you today.  *If you need a refill on your cardiac medications before your next appointment, please call your pharmacy*  Testing/Procedures: Your physician has requested that you have a lower extremity arterial duplex. During this test, ultrasound is used to evaluate arterial blood flow in the legs. Allow one hour for this exam. There are no restrictions or special instructions. This will take place at [address], 4th floor **To do in May**  Please note: We ask at that you not bring children with you during ultrasound (echo/ vascular) testing. Due to room size and safety concerns, children are not allowed in the ultrasound rooms during exams. Our front office staff cannot provide observation of children in our lobby area while testing is being conducted. An adult accompanying a patient to their appointment will only be allowed in the ultrasound room at the discretion of the ultrasound technician under special circumstances. We apologize for any inconvenience.  Your physician has requested that you have an ankle brachial index (ABI). During this test an ultrasound and blood pressure cuff are used to evaluate the arteries that supply the arms and legs with blood. Allow thirty minutes for this exam. There are no restrictions or special instructions. This will take place at [address] 4th floor  **To do in May**   Please note: We ask at that you not bring children with you during ultrasound (echo/ vascular) testing. Due to room size and safety concerns, children are not allowed in the ultrasound rooms during exams. Our front office staff cannot provide observation of children in our lobby area while testing is being conducted. An adult accompanying a patient to their appointment will only be allowed in the ultrasound room at the  discretion of the ultrasound technician under special circumstances. We apologize for any inconvenience.   Follow-Up: At [facility], you and your health needs are our priority.  As part of our continuing mission to provide you with exceptional heart care, our providers are all part of one team.  This team includes your primary Cardiologist (physician) and Advanced Practice Providers or APPs (Physician Assistants and Nurse Practitioners) who all work together to provide you with the care you need, when you need it.  Your next appointment:   12 month(s)  Provider:   Dorn Lesches, MD    We recommend signing up for the patient portal called MyChart.  Sign up information is provided on this After Visit Summary.  MyChart is used to connect with patients for Virtual Visits (Telemedicine).  Patients are able to view lab/test results, encounter notes, upcoming appointments, etc.  Non-urgent messages can be sent to your provider as well.   To learn more about what you can do with MyChart, go to [email or web address].

## 2023-11-24 NOTE — Assessment & Plan Note (Signed)
 Aaron Jimenez returns today for posthospital follow-up.  He has a history of PAD status post right SFA intervention 09/01/2023 for lifestyle-limiting claudication.  He had disease on the left as well which he was symptomatic from and underwent staged intervention 10/27/2023 reducing tandem lesions from 7080% to 0% with [place] balloon DCB.  His postprocedure Dopplers revealed a widely patent left SFA with an ABI that improved from 0.51-0.79.  His claudication has resolved.  He is on aspirin  clopidogrel .  I am going to repeat Dopplers in 6 months and I will see him back in 1 year.

## 2023-11-24 NOTE — Assessment & Plan Note (Signed)
 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  This will be repeated in 1 year.

## 2023-11-24 NOTE — Progress Notes (Signed)
 11/24/2023 [name] [facility]   25-Dec-1955  [id number]  Primary Physician Glenford Harlene SAILOR, NP Primary Cardiologist: Dorn JINNY Lesches MD [place], [place]  HPI:  Aaron Jimenez is a 68 y.o.  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 He is accompanied by his wife [name] today. He was referred by Dr. Bernie  his primary cardiologist, for evaluation of claudication.  His risk factors include over 50-pack-year tobacco abuse having quit 10 years ago. He has treated hypertension and hyperlipidemia. He has never had a heart attack or stroke. He denies chest pain but does get some dyspnea on exertion. He and his wife unfortunately are not very active because of his claudication. He noticed onset of claudication 3 to 4 months ago which is lifestyle limiting, left greater than right. He did have Doppler studies performed 05/30/2023 revealing right ABI of 0.63, left of 0.71 with what appears to be a high-frequency signal in his distal right common femoral artery and distal right SFA, and principally tibial disease on the left   I performed peripheral angiography and intervention on him 09/01/2023 revealing high-grade mid right SFA stenosis with one-vessel runoff.  The anterior tibial artery.  I performed chocolate balloon angioplasty followed by [facility] with excellent result.  His follow-up Dopplers did not show an increase in his right ABI however his velocities did improve as that his claudication.  He did have 80% proximal, mid/distal left SFA disease which was calcified with one-vessel runoff via peroneal artery.  He continues to be symptomatic on the left and wishes to proceed with staged outpatient left SFA intervention.  I performed staged left SFA intervention 10/27/2023 up to tandem mid and distal left SFA stenoses using trucker balloon angioplasty followed by DCB.  He went home the following day.   His claudication is resolved.  His left ABI increased from 0.51-0.79.   Current Meds  Medication Sig   aspirin  EC 81 MG tablet Take 1 tablet (81 mg total) by mouth daily. Swallow whole.   atorvastatin  (LIPITOR) 80 MG tablet Take 1 tablet (80 mg total) by mouth daily.   clopidogrel  (PLAVIX ) 75 MG tablet Take 1 tablet (75 mg total) by mouth daily with breakfast.   ezetimibe  (ZETIA ) 10 MG tablet Take 1 tablet (10 mg total) by mouth daily. (Patient taking differently: Take 10 mg by mouth at bedtime.)   FLUoxetine  (PROZAC ) 40 MG capsule Take 80 mg by mouth in the morning.   lisinopril  (ZESTRIL ) 10 MG tablet Take 10 mg by mouth daily.   pantoprazole  (PROTONIX ) 40 MG tablet Take 1 tablet (40 mg total) by mouth daily.   sertraline  (ZOLOFT ) 100 MG tablet Take 100 mg by mouth in the morning and at bedtime.   traMADol  (ULTRAM ) 50 MG tablet Take 50 mg by mouth 4 (four) times daily as needed for pain.     Allergies  Allergen Reactions   Bee Venom Anaphylaxis   Mixed Ragweed Shortness Of Breath    Social History   Socioeconomic History   Marital status: Married    Spouse name: Not on file   Number of children: Not on file   Years of education: Not on file   Highest education level: Not on file  Occupational History   Not on file  Tobacco Use   Smoking status: Former   Smokeless tobacco: Not on file  Vaping Use  Vaping status: Never Used  Substance and Sexual Activity   Alcohol use: Yes    Comment: rare   Drug use: No   Sexual activity: Yes  Other Topics Concern   Not on file  Social History Narrative   Left handed    Lives with wife one story home   Caffeine a pot a day or more.   Welder - pipe fits ( works)   Social Drivers of Corporate Investment Banker Strain: Not on [employer] Insecurity: Patient Declined (10/28/2023)   Hunger Vital Sign    Worried About Programme Researcher, Broadcasting/film/video in the Last Year: Patient declined    Barista in the Last Year: Patient declined   Transportation Needs: Patient Declined (10/28/2023)   PRAPARE - Administrator, Civil Service (Medical): Patient declined    Lack of Transportation (Non-Medical): Patient declined  Physical Activity: Not on file  Stress: Not on file  Social Connections: Unknown (10/28/2023)   Social Connection and Isolation Panel    Frequency of Communication with Friends and Family: Patient declined    Frequency of Social Gatherings with Friends and Family: Not on file    Attends Religious Services: Not on file    Active Member of Clubs or Organizations: Patient declined    Attends Banker Meetings: Patient declined    Marital Status: Patient declined  Intimate Partner Violence: Unknown (10/28/2023)   Humiliation, Afraid, Rape, and Kick questionnaire    Fear of Current or Ex-Partner: Patient declined    Emotionally Abused: Patient declined    Physically Abused: Not on file    Sexually Abused: Patient declined     Review of Systems: General: negative for chills, fever, night sweats or weight changes.  Cardiovascular: negative for chest pain, dyspnea on exertion, edema, orthopnea, palpitations, paroxysmal nocturnal dyspnea or shortness of breath Dermatological: negative for rash Respiratory: negative for cough or wheezing Urologic: negative for hematuria Abdominal: negative for nausea, vomiting, diarrhea, bright red blood per rectum, melena, or hematemesis Neurologic: negative for visual changes, syncope, or dizziness All other systems reviewed and are otherwise negative except as noted above.    Blood pressure 137/67, pulse (!) 58, height 6' 3 (1.905 m), weight 205 lb 6.4 oz (93.2 kg), SpO2 100%.  General appearance: alert and no distress Neck: no adenopathy, no carotid bruit, no JVD, supple, symmetrical, trachea midline, and thyroid not enlarged, symmetric, no tenderness/mass/nodules Lungs: clear to auscultation bilaterally Heart: regular rate and rhythm, S1, S2  normal, no murmur, click, rub or gallop Extremities: extremities normal, atraumatic, no cyanosis or edema Pulses: 2+ and symmetric Skin: Skin color, texture, turgor normal. No rashes or lesions Neurologic: Grossly normal  EKG not performed today      ASSESSMENT AND PLAN:   Claudication in peripheral vascular disease Mr. Friedel returns today for posthospital follow-up.  He has a history of PAD status post right SFA intervention 09/01/2023 for lifestyle-limiting claudication.  He had disease on the left as well which he was symptomatic from and underwent staged intervention 10/27/2023 reducing tandem lesions from 7080% to 0% with [place] balloon DCB.  His postprocedure Dopplers revealed a widely patent left SFA with an ABI that improved from 0.51-0.79.  His claudication has resolved.  He is on aspirin  clopidogrel .  I am going to repeat Dopplers in 6 months and I will see him back in 1 year.  Carotid artery disease Recent carotid Dopplers performed 11/10/2023 revealed moderate right ICA stenosis with  an occluded left ICA.  This will be repeated in 1 year.     Dorn DOROTHA Lesches MD FACP,FACC,FAHA, [facility] 11/24/2023 10:50 AM

## 2023-12-01 DIAGNOSIS — F32A Depression, unspecified: Secondary | ICD-10-CM | POA: Insufficient documentation

## 2023-12-01 DIAGNOSIS — C6290 Malignant neoplasm of unspecified testis, unspecified whether descended or undescended: Secondary | ICD-10-CM | POA: Insufficient documentation

## 2023-12-01 DIAGNOSIS — R112 Nausea with vomiting, unspecified: Secondary | ICD-10-CM | POA: Insufficient documentation

## 2023-12-01 DIAGNOSIS — F419 Anxiety disorder, unspecified: Secondary | ICD-10-CM | POA: Insufficient documentation

## 2023-12-01 DIAGNOSIS — C801 Malignant (primary) neoplasm, unspecified: Secondary | ICD-10-CM | POA: Insufficient documentation

## 2023-12-05 ENCOUNTER — Encounter: Payer: Self-pay | Admitting: Cardiology

## 2023-12-05 ENCOUNTER — Ambulatory Visit: Attending: Cardiology | Admitting: Cardiology

## 2023-12-05 VITALS — BP 126/72 | HR 72 | Ht 75.0 in | Wt 201.8 lb

## 2023-12-05 DIAGNOSIS — E785 Hyperlipidemia, unspecified: Secondary | ICD-10-CM

## 2023-12-05 DIAGNOSIS — I6522 Occlusion and stenosis of left carotid artery: Secondary | ICD-10-CM

## 2023-12-05 DIAGNOSIS — I7121 Aneurysm of the ascending aorta, without rupture: Secondary | ICD-10-CM | POA: Diagnosis not present

## 2023-12-05 DIAGNOSIS — I2510 Atherosclerotic heart disease of native coronary artery without angina pectoris: Secondary | ICD-10-CM

## 2023-12-05 DIAGNOSIS — I10 Essential (primary) hypertension: Secondary | ICD-10-CM | POA: Diagnosis not present

## 2023-12-05 NOTE — Patient Instructions (Signed)
 Medication Instructions:  Your physician recommends that you continue on your current medications as directed. Please refer to the Current Medication list given to you today.  *If you need a refill on your cardiac medications before your next appointment, please call your pharmacy*   Lab Work: None ordered If you have labs (blood work) drawn today and your tests are completely normal, you will receive your results only by: MyChart Message (if you have MyChart) OR A paper copy in the mail If you have any lab test that is abnormal or we need to change your treatment, we will call you to review the results.  Testing/Procedures: Your physician has requested that you have an echocardiogram. Echocardiography is a painless test that uses sound waves to create images of your heart. It provides your doctor with information about the size and shape of your heart and how well your heart's chambers and valves are working. This procedure takes approximately one hour. There are no restrictions for this procedure. Please do NOT wear cologne, perfume, aftershave, or lotions (deodorant is allowed). Please arrive 15 minutes prior to your appointment time.  Please note: We ask at that you not bring children with you during ultrasound (echo/ vascular) testing. Due to room size and safety concerns, children are not allowed in the ultrasound rooms during exams. Our front office staff cannot provide observation of children in our lobby area while testing is being conducted. An adult accompanying a patient to their appointment will only be allowed in the ultrasound room at the discretion of the ultrasound technician under special circumstances. We apologize for any inconvenience.  Follow-Up: At [facility], you and your health needs are our priority.  As part of our continuing mission to provide you with exceptional heart care, we have created designated Provider Care Teams.  These Care Teams include your primary  Cardiologist (physician) and Advanced Practice Providers (APPs -  Physician Assistants and Nurse Practitioners) who all work together to provide you with the care you need, when you need it.  We recommend signing up for the patient portal called MyChart.  Sign up information is provided on this After Visit Summary.  MyChart is used to connect with patients for Virtual Visits (Telemedicine).  Patients are able to view lab/test results, encounter notes, upcoming appointments, etc.  Non-urgent messages can be sent to your provider as well.   To learn more about what you can do with MyChart, go to [email or web address].    Your next appointment:   6 month(s)  The format for your next appointment:   In Person  Provider:   Lamar Fitch, MD   Other Instructions Echocardiogram An echocardiogram is a test that uses sound waves (ultrasound) to produce images of the heart. Images from an echocardiogram can provide important information about: Heart size and shape. The size and thickness and movement of your heart's walls. Heart muscle function and strength. Heart valve function or if you have stenosis. Stenosis is when the heart valves are too narrow. If blood is flowing backward through the heart valves (regurgitation). A tumor or infectious growth around the heart valves. Areas of heart muscle that are not working well because of poor blood flow or injury from a heart attack. Aneurysm detection. An aneurysm is a weak or damaged part of an artery wall. The wall bulges out from the normal force of blood pumping through the body. Tell a health care provider about: Any allergies you have. All medicines you are taking, including vitamins, herbs,  eye drops, creams, and over-the-counter medicines. Any blood disorders you have. Any surgeries you have had. Any medical conditions you have. Whether you are pregnant or may be pregnant. What are the risks? Generally, this is a safe test.  However, problems may occur, including an allergic reaction to dye (contrast) that may be used during the test. What happens before the test? No specific preparation is needed. You may eat and drink normally. What happens during the test? You will take off your clothes from the waist up and put on a hospital gown. Electrodes or electrocardiogram (ECG)patches may be placed on your chest. The electrodes or patches are then connected to a device that monitors your heart rate and rhythm. You will lie down on a table for an ultrasound exam. A gel will be applied to your chest to help sound waves pass through your skin. A handheld device, called a transducer, will be pressed against your chest and moved over your heart. The transducer produces sound waves that travel to your heart and bounce back (or echo back) to the transducer. These sound waves will be captured in real-time and changed into images of your heart that can be viewed on a video monitor. The images will be recorded on a computer and reviewed by your health care provider. You may be asked to change positions or hold your breath for a short time. This makes it easier to get different views or better views of your heart. In some cases, you may receive contrast through an IV in one of your veins. This can improve the quality of the pictures from your heart. The procedure may vary among health care providers and hospitals.   What can I expect after the test? You may return to your normal, everyday life, including diet, activities, and medicines, unless your health care provider tells you not to do that. Follow these instructions at home: It is up to you to get the results of your test. Ask your health care provider, or the department that is doing the test, when your results will be ready. Keep all follow-up visits. This is important. Summary An echocardiogram is a test that uses sound waves (ultrasound) to produce images of the heart. Images  from an echocardiogram can provide important information about the size and shape of your heart, heart muscle function, heart valve function, and other possible heart problems. You do not need to do anything to prepare before this test. You may eat and drink normally. After the echocardiogram is completed, you may return to your normal, everyday life, unless your health care provider tells you not to do that. This information is not intended to replace advice given to you by your health care provider. Make sure you discuss any questions you have with your health care provider. Document Revised: 08/28/2019 Document Reviewed: 08/28/2019 Elsevier Patient Education  2021 Elsevier Inc.   Important Information About Sugar

## 2023-12-05 NOTE — Progress Notes (Signed)
 Cardiology Office Note:    Date:  12/05/2023   ID:  Aaron Jimenez, DOB [birth year], MRN [id number]  PCP:  Glenford Harlene SAILOR, NP  Cardiologist:  Lamar Fitch, MD    Referring MD: Glenford Harlene SAILOR, NP   No chief complaint on file. Doing better  History of Present Illness:    Aaron Jimenez is a 68 y.o. male past medical history significant for coronary artery disease, course [place] shortly mild disease ascending aortic aneurysm measuring 4 3 mm, GERD, bipolar disorder, dyslipidemia, history of tobacco abuse, peripheral vascular disease.  Comes today to months for follow-up since I seen him last time he did have angioplasty of above SFA down by Dr. Dorn Dames with excellent results.  He does not have any claudication he can walk climb stairs he does have a steep driveway now he can goes back and forth to pick up mail with no difficulties no need to stop because of pain in the legs.  Really quite spectacular effects.  Denies have any chest pain tightness squeezing pressure burning chest no TIA CVA-like symptoms  Past Medical History:  Diagnosis Date   Anxiety    Ascending aortic aneurysm (HCC) 43 mm 08/19/2021   Bilateral hearing loss 04/17/2020   Bipolar II disorder, most recent episode major depressive (HCC) 04/17/2020   Cancer (HCC)    tumor removed off lt testicle   Carotid artery disease 11/24/2023   Carotid artery disease     Carpal tunnel syndrome 04/17/2020   Cervical disc disease 04/17/2020   Mar 09, 2013 Entered By: HILDY NEWPORT Comment: sp fusion at [id number]     Chronic post-traumatic stress disorder 04/17/2020   Chronic ulcer of toe (HCC) 04/17/2020   Claudication in peripheral vascular disease 09/01/2023   Claudication     Coronary artery disease mild nonobstructive based on coronary CT angio 05/02/2023   Depression    Dyslipidemia 04/17/2020   Elevated blood-pressure reading without diagnosis of hypertension 04/17/2020   Essential hypertension 04/17/2020    Gastroesophageal reflux disease 04/17/2020   Mood disorder 04/17/2020   May 06, 2015 Entered By: GARNER RICHARDSON HERO Comment: MDD moderate recurrent w/o PF.     Neuropathy 04/17/2020   Peripheral arterial disease 07/20/2023   Peripheral arterial disease     Peroneal tendinitis 04/17/2020   PONV (postoperative nausea and vomiting)    Restless legs syndrome 04/17/2020   Soft tissue mass 04/17/2020   Syncope and collapse 06/16/2021   Testicular cancer (HCC)    Tobacco use disorder 04/17/2020   Traumatic brain injury (HCC) 04/17/2020    Past Surgical History:  Procedure Laterality Date   ANTERIOR CERVICAL DECOMP/DISCECTOMY FUSION N/A 07/27/2012   Procedure: ANTERIOR CERVICAL DECOMPRESSION/DISCECTOMY FUSION CERVICALSIX-SEVEN1 LEVEL/HARDWARE REMOVAL CERVICAL FIVE-SIX;  Surgeon: Alm GORMAN Molt, MD;  Location: [facility] NEURO ORS;  Service: Neurosurgery;  Laterality: N/A;  Anterior Cervical Decompression/Discectomy Fusion, Cervical Six-Seven Fusion, Removal of hardware at Cervical Five-Six   BACK SURGERY     neck fusion   KNEE ARTHROSCOPY     Left testicle removed  Left    LOWER EXTREMITY ANGIOGRAPHY N/A 09/01/2023   Procedure: Lower Extremity Angiography;  Surgeon: Court Dorn PARAS, MD;  Location: [facility] INVASIVE CV LAB;  Service: Cardiovascular;  Laterality: N/A;   LOWER EXTREMITY ANGIOGRAPHY N/A 10/27/2023   Procedure: Lower Extremity Angiography;  Surgeon: Court Dorn PARAS, MD;  Location: [facility] INVASIVE CV LAB;  Service: Cardiovascular;  Laterality: N/A;   LOWER EXTREMITY INTERVENTION  09/01/2023   Procedure: LOWER EXTREMITY INTERVENTION;  Surgeon:  Court Dorn PARAS, MD;  Location: [facility] INVASIVE CV LAB;  Service: Cardiovascular;;   LOWER EXTREMITY INTERVENTION  10/27/2023   Procedure: LOWER EXTREMITY INTERVENTION;  Surgeon: Court Dorn PARAS, MD;  Location: [facility] INVASIVE CV LAB;  Service: Cardiovascular;;   NOSE SURGERY     2020   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 Swallow whole.   atorvastatin  (LIPITOR) 80 MG tablet Take 1 tablet (80 mg total) by mouth daily.   clopidogrel  (PLAVIX ) 75 MG tablet Take 1 tablet (75 mg total) by mouth daily with breakfast.   ezetimibe  (ZETIA ) 10 MG tablet Take 1 tablet (10 mg total) by mouth daily.   FLUoxetine  (PROZAC ) 40 MG capsule Take 80 mg by mouth in the morning.   lisinopril  (ZESTRIL ) 10 MG tablet Take 10 mg by mouth daily.   pantoprazole  (PROTONIX ) 40 MG tablet Take 1 tablet (40 mg total) by mouth daily.   sertraline  (ZOLOFT ) 100 MG tablet Take 100 mg by mouth in the morning and at bedtime.   traMADol  (ULTRAM ) 50 MG tablet Take 50 mg by mouth 4 (four) times daily as needed for pain.     Allergies:   Bee venom and Mixed ragweed   Social History   Socioeconomic History   Marital status: Married    Spouse name: Not on file   Number of children: Not on file   Years of education: Not on file   Highest education level: Not on file  Occupational History   Not on file  Tobacco Use   Smoking status: Former   Smokeless tobacco: Not on file  Vaping Use   Vaping status: Never Used  Substance and Sexual Activity   Alcohol use: Yes    Comment: rare   Drug use: No   Sexual activity: Yes  Other Topics Concern   Not on file  Social History Narrative   Left handed    Lives with wife one story home   Caffeine a pot a day or more.   Welder - pipe fits ( works)   Social Drivers of Corporate Investment Banker Strain: Not on [employer] Insecurity: Patient Declined (10/28/2023)   Hunger Vital Sign    Worried About Programme Researcher, Broadcasting/film/video in the Last Year: Patient declined    Barista in the Last Year: Patient declined  Transportation Needs: Patient Declined (10/28/2023)   PRAPARE - Administrator, Civil Service (Medical): Patient declined    Lack of Transportation (Non-Medical): Patient declined  Physical  Activity: Not on file  Stress: Not on file  Social Connections: Unknown (10/28/2023)   Social Connection and Isolation Panel    Frequency of Communication with Friends and Family: Patient declined    Frequency of Social Gatherings with Friends and Family: Not on file    Attends Religious Services: Not on Marketing Executive or Organizations: Patient declined    Attends Banker Meetings: Patient declined    Marital Status: Patient declined     Family History: The patient's family history includes Cancer in his father; Diabetes in his father and paternal grandfather; Heart murmur in his mother. ROS:   Please see the history of present illness.    All 14 point review of systems negative except as described per history of  present illness  EKGs/Labs/Other Studies Reviewed:         Recent Labs: 10/28/2023: BUN 15; Creatinine, Ser 1.42; Hemoglobin 11.0; Platelets 341; Potassium 4.2; Sodium 138  Recent Lipid Panel    Component Value Date/Time   CHOL 112 10/28/2023 0336   CHOL 203 (H) 11/08/2022 0930   TRIG 117 10/28/2023 0336   HDL 39 (L) 10/28/2023 0336   HDL 49 11/08/2022 0930   CHOLHDL 2.9 10/28/2023 0336   VLDL 23 10/28/2023 0336   LDLCALC 50 10/28/2023 0336   LDLCALC 130 (H) 11/08/2022 0930    Physical Exam:    VS:  BP 126/72   Pulse 72   Ht 6' 3 (1.905 m)   Wt 201 lb 12.8 oz (91.5 kg)   SpO2 97%   BMI 25.22 kg/m     Wt Readings from Last 3 Encounters:  12/05/23 201 lb 12.8 oz (91.5 kg)  11/24/23 205 lb 6.4 oz (93.2 kg)  10/27/23 212 lb (96.2 kg)     GEN:  Well nourished, well developed in no acute distress HEENT: Normal NECK: No JVD; No carotid bruits LYMPHATICS: No lymphadenopathy CARDIAC: RRR, no murmurs, no rubs, no gallops RESPIRATORY:  Clear to auscultation without rales, wheezing or rhonchi  ABDOMEN: Soft, non-tender, non-distended MUSCULOSKELETAL:  No edema; No deformity  SKIN: Warm and dry LOWER EXTREMITIES: no  swelling NEUROLOGIC:  Alert and oriented x 3 PSYCHIATRIC:  Normal affect   ASSESSMENT:    1. Coronary artery disease involving native coronary artery of native heart without angina pectoris   2. Aneurysm of ascending aorta without rupture   3. Occlusion of left carotid artery   4. Essential hypertension   5. Dyslipidemia    PLAN:    In order of problems listed above:  Coronary disease stable only mild disease continue antiplatelet therapy asymptomatic. Aneurysm of the ascending artery, will schedule him to have echocardiogram to assess the size. Carotic arterial disease, occluded on the left, up to 79% stenosis on the right, continue monitoring.  He will need to have another carotic ultrasound done in about 6 months. Peripheral vascular disease disease involving lower extremities excellent results of angioplasty performed by Dr. Dorn Dames   Medication Adjustments/Labs and Tests Ordered: Current medicines are reviewed at length with the patient today.  Concerns regarding medicines are outlined above.  No orders of the defined types were placed in this encounter.  Medication changes: No orders of the defined types were placed in this encounter.   Signed, Lamar DOROTHA Fitch, MD, [facility] 12/05/2023 3:37 PM    [facility]

## 2023-12-05 NOTE — Addendum Note (Signed)
 Addended by: GLENFORD ALAN CROME on: 12/05/2023 03:42 PM   Modules accepted: Orders

## 2023-12-08 ENCOUNTER — Ambulatory Visit: Admitting: Podiatry

## 2023-12-12 ENCOUNTER — Ambulatory Visit

## 2023-12-12 ENCOUNTER — Ambulatory Visit: Admitting: Podiatry

## 2023-12-12 ENCOUNTER — Encounter: Payer: Self-pay | Admitting: Podiatry

## 2023-12-12 DIAGNOSIS — M8660 Other chronic osteomyelitis, unspecified site: Secondary | ICD-10-CM | POA: Diagnosis not present

## 2023-12-12 DIAGNOSIS — I739 Peripheral vascular disease, unspecified: Secondary | ICD-10-CM | POA: Diagnosis not present

## 2023-12-12 DIAGNOSIS — G609 Hereditary and idiopathic neuropathy, unspecified: Secondary | ICD-10-CM

## 2023-12-12 DIAGNOSIS — L97513 Non-pressure chronic ulcer of other part of right foot with necrosis of muscle: Secondary | ICD-10-CM

## 2023-12-12 MED ORDER — DOXYCYCLINE HYCLATE 100 MG PO TABS: 100.0000 mg | ORAL_TABLET | Freq: Two times a day (BID) | ORAL | 0 refills | Status: AC

## 2023-12-12 NOTE — Patient Instructions (Signed)
 Instructions for Wound Care  The most important step to healing a foot wound is to reduce the pressure on your foot - it is extremely important to stay off your foot as much as possible and wear the shoe/boot as instructed.  Cleanse your foot with saline wash or warm soapy water  (dial antibacterial soap or similar).  Blot dry.  Apply prescribed medication (betadine) to your wound and cover with gauze and a bandage.  May hold bandage in place with Coban (self sticky wrap), Ace bandage or tape.  You may find dressing supplies at your local [employer], [employer], drug store or medical supply store.  Monitor for any signs/symptoms of infection. If there is any increase in redness, red streaks, increase in drainage, warmth to your foot please give us  a call. Also, if you start to run a fever or have flu-like symptoms that can also be a sign of infection. Call the office immediately if any occur or go directly to the emergency room.   If you have any questions, please feel free to give us  a call at [phone] or if you are on MyChart you can always send me a message if needed.

## 2023-12-12 NOTE — Progress Notes (Unsigned)
 Chief Complaint  Patient presents with   Wound Check    Pt is here for a wound on his right hallux, he stated that it has not gotten any better     HPI: 68 y.o. male presenting today with concern for right sub first metatarsal head ulceration for close to the past month.  He is accompanied by his wife today.  Patient reports 50-pack-year tobacco history, quit smoking approximately 10 years ago.  Reports peripheral neuropathy of unknown cause, PAD of bilateral lower extremities, treated with right lower extremity angiogram by Dr. Wadie on 09/01/2023 however noted to have one-vessel runoff.  Also has history of right first toe ulceration which does not appear to have recurred, last was seen for this by Dr. Loel in December of 2024.  Past Medical History:  Diagnosis Date   Anxiety    Ascending aortic aneurysm (HCC) 43 mm 08/19/2021   Bilateral hearing loss 04/17/2020   Bipolar II disorder, most recent episode major depressive (HCC) 04/17/2020   Cancer (HCC)    tumor removed off lt testicle   Carotid artery disease 11/24/2023   Carotid artery disease     Carpal tunnel syndrome 04/17/2020   Cervical disc disease 04/17/2020   Mar 09, 2013 Entered By: HILDY NEWPORT Comment: sp fusion at c567     Chronic post-traumatic stress disorder 04/17/2020   Chronic ulcer of toe (HCC) 04/17/2020   Claudication in peripheral vascular disease 09/01/2023   Claudication     Coronary artery disease mild nonobstructive based on coronary CT angio 05/02/2023   Depression    Dyslipidemia 04/17/2020   Elevated blood-pressure reading without diagnosis of hypertension 04/17/2020   Essential hypertension 04/17/2020   Gastroesophageal reflux disease 04/17/2020   Mood disorder 04/17/2020   May 06, 2015 Entered By: GARNER RICHARDSON HERO Comment: MDD moderate recurrent w/o PF.     Neuropathy 04/17/2020   Peripheral arterial disease 07/20/2023   Peripheral arterial disease     Peroneal tendinitis 04/17/2020   PONV  (postoperative nausea and vomiting)    Restless legs syndrome 04/17/2020   Soft tissue mass 04/17/2020   Syncope and collapse 06/16/2021   Testicular cancer (HCC)    Tobacco use disorder 04/17/2020   Traumatic brain injury (HCC) 04/17/2020   Past Surgical History:  Procedure Laterality Date   ANTERIOR CERVICAL DECOMP/DISCECTOMY FUSION N/A 07/27/2012   Procedure: ANTERIOR CERVICAL DECOMPRESSION/DISCECTOMY FUSION CERVICALSIX-SEVEN1 LEVEL/HARDWARE REMOVAL CERVICAL FIVE-SIX;  Surgeon: Alm GORMAN Molt, MD;  Location: [facility];  Service: Neurosurgery;  Laterality: N/A;  Anterior Cervical Decompression/Discectomy Fusion, Cervical Six-Seven Fusion, Removal of hardware at Cervical Five-Six   BACK SURGERY     neck fusion   KNEE ARTHROSCOPY     Left testicle removed  Left    LOWER EXTREMITY ANGIOGRAPHY N/A 09/01/2023   Procedure: Lower Extremity Angiography;  Surgeon: Court Dorn PARAS, MD;  Location: [facility] INVASIVE CV LAB;  Service: Cardiovascular;  Laterality: N/A;   LOWER EXTREMITY ANGIOGRAPHY N/A 10/27/2023   Procedure: Lower Extremity Angiography;  Surgeon: Court Dorn PARAS, MD;  Location: [facility] INVASIVE CV LAB;  Service: Cardiovascular;  Laterality: N/A;   LOWER EXTREMITY INTERVENTION  09/01/2023   Procedure: LOWER EXTREMITY INTERVENTION;  Surgeon: Court Dorn PARAS, MD;  Location: [facility] INVASIVE CV LAB;  Service: Cardiovascular;;   LOWER EXTREMITY INTERVENTION  10/27/2023   Procedure: LOWER EXTREMITY INTERVENTION;  Surgeon: Court Dorn PARAS, MD;  Location: [facility] INVASIVE CV LAB;  Service: Cardiovascular;;   NOSE SURGERY     2020   TOE 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  General: The patient is alert and oriented x3 in no acute distress.  Dermatology: Skin is warm, dry and supple bilateral lower  extremities. Interspaces are clear of maceration and debris.      Wound 1:  Location:  right sub first metatarsal head ulceration        Depth:  porbes to capsule        Wound Border:  nonviable hyperkeratosis        Wound Base:  fibrogranular        Drainage: none, bleeding wound base       Odor?:  no        Surrounding Tissue:  normal        Infected?:  depth concerning for underlying infection        Necrosis?:  Loss of skin and subcutaneous tissue, loss of vascular involvement        Pain?:  No        Tunneling: No       Dimensions (cm): Predebridement measurements 1 x 0.6 x 1.2.  Postdebridement measurements 1.2 x 1 x 1.2 cm        Vascular: Pedal pulses are faintly palpable, decreased pedal hair growth. Capillary refill intact to the digits.  Neurological: Light touch sensation decreased to toes, protective sensation absent.  Musculoskeletal Exam: Prior right second toe amputation      No data to display           RADIOGRAPHIC EXAM: Right foot 3 views 12/12/2023 Prior right second toe amputation.  Distal phalanx of first toe chronic appearing cortical erosion of the distal medial tuft present, appearance consistent with chronic appearing osteomyelitis.  Subfirst metatarsal head region associated with the current ulceration no obvious osteolysis or cortical erosion however there is possible decrease in density of the tibial sesamoid  ASSESSMENT / PLAN OF CARE: 1. Chronic osteomyelitis (HCC)   2. 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 Idiopathic neuropathy   4. PAD (peripheral artery disease)      Meds ordered this encounter  Medications   doxycycline  (VIBRA -TABS) 100 MG tablet    Sig: Take 1 tablet (100 mg total) by mouth 2 (two) times daily for 14 days.    Dispense:  28 tablet    Refill:  0   DG FOOT COMPLETE RIGHT MR FOOT RIGHT WO CONTRAST  # Ulceration right subfirst metatarsal head with necrosis of muscle Culture swabs of the wound(s)  were obtained today.  The ulceration was sharply debrided of hyperkeratotic and devitalized soft tissue with sterile #15 blade and curette to the level of muscle fascia layer.  Postdebridement measurements 1.2 x 1 x 1.2 cm.  Hemostasis obtained, 1 to 2 cc of blood loss noted.SABRA  Ulceration packed with Betadine gauze.  Light dressing applied.  Reviewed off-loading with patient.  Surgical shoe dispensed for patient to wear at all times WB.  Offloading felt padding applied.  Reviewed daily dressing changes with patient.  Discussed risks / concerns regarding ulcer with patient and possible sequelae if left untreated.  Stressed importance of infection prevention at home.  # Chronic osteomyelitis right foot - Evidence of osteomyelitis right first toe seen on radiographs - Concern for osteomyelitis associate  with current ulceration subfirst metatarsal head due to depth of ulcer - Starting patient on oral doxycycline  until culture data emerges - Obtaining MRI -Discussed with patient that if MRI suggestive of osteomyelitis, surgical intervention describing amputation of the infected bone may need to be considered. - Complicating factors of neuropathy and PAD.  Has had recent endovascular intervention, noted to have one-vessel runoff however mild disease on recent ABI right lower extremity. - Of note recent arterial duplex showing decreased velocities with monophasic signals of the tibial arteries distal to the TP trunk from 11/10/23 -Reviewed outside notes from Dr. Court interventional cardiology detailing lower extremity angiograms -Instructed patient to contact office or go to emergency room immediately if signs or symptoms of worsening or systemic infection develop -I certify that this diagnosis represents a distinct and separate diagnosis that requires evaluation and treatment separate from other procedures or diagnosis    Return in about 1 week (around 12/19/2023) for Wound Care/mri.   Karey Suthers L.  Lamount MAUL, AACFAS [facility]     [address] [place], [place] [address]                Office [phone]  Fax [phone]

## 2023-12-19 ENCOUNTER — Encounter: Payer: Self-pay | Admitting: Podiatry

## 2023-12-19 ENCOUNTER — Ambulatory Visit: Admitting: Podiatry

## 2023-12-19 DIAGNOSIS — L97513 Non-pressure chronic ulcer of other part of right foot with necrosis of muscle: Secondary | ICD-10-CM | POA: Diagnosis not present

## 2023-12-19 DIAGNOSIS — M8660 Other chronic osteomyelitis, unspecified site: Secondary | ICD-10-CM

## 2023-12-19 DIAGNOSIS — G609 Hereditary and idiopathic neuropathy, unspecified: Secondary | ICD-10-CM

## 2023-12-19 DIAGNOSIS — I739 Peripheral vascular disease, unspecified: Secondary | ICD-10-CM

## 2023-12-19 NOTE — Progress Notes (Signed)
 Subjective:  Patient ID: Aaron Jimenez, male    DOB: December 17, 1955,  MRN: [id number]  68 y.o. male is seen today for evaluation of right sub first metatarsal head ulceration.  He is accompanied by his wife.  50-pack-year tobacco history.  Quit smoking approximately 10 years ago.  Does have peripheral neuropathy and PAD of bilateral lower extremities.  Had a right lower extremity angiogram by Dr. Wadie on September 01, 2023, noted to have one-vessel runoff.  Last visit MRI was ordered, has not been obtained.  Wound cultures from prior visit pending at time of examination.  He has been taking doxycycline .  Prescribed wound care regimen: Betadine Solution dressing daily.  Patient is using Darco Shoe.  Patient relates wound has improved.  Patient The patient is having no constitutional symptoms, denying fever, chills, anorexia, or weight loss.     Current Outpatient Medications on File Prior to Visit  Medication Sig Dispense Refill   aspirin  EC 81 MG tablet Take 1 tablet (81 mg total) by mouth daily. Swallow whole.     atorvastatin  (LIPITOR) 80 MG tablet Take 1 tablet (80 mg total) by mouth daily. 90 tablet 3   clopidogrel  (PLAVIX ) 75 MG tablet Take 1 tablet (75 mg total) by mouth daily with breakfast. 90 tablet 1   doxycycline  (VIBRA -TABS) 100 MG tablet Take 1 tablet (100 mg total) by mouth 2 (two) times daily for 14 days. 28 tablet 0   ezetimibe  (ZETIA ) 10 MG tablet Take 1 tablet (10 mg total) by mouth daily. 90 tablet 3   FLUoxetine  (PROZAC ) 40 MG capsule Take 80 mg by mouth in the morning.     lisinopril  (ZESTRIL ) 10 MG tablet Take 10 mg by mouth daily.     pantoprazole  (PROTONIX ) 40 MG tablet Take 1 tablet (40 mg total) by mouth daily. 90 tablet 0   sertraline  (ZOLOFT ) 100 MG tablet Take 100 mg by mouth in the morning and at bedtime.     traMADol  (ULTRAM ) 50 MG tablet Take 50 mg by mouth 4 (four) times daily as needed for pain.     No current facility-administered medications on file prior to  visit.     Allergies  Allergen Reactions   Bee Venom Anaphylaxis   Mixed Ragweed Shortness Of Breath     Objective:  Physical Exam: There were no vitals filed for this visit.   Aaron Jimenez is a pleasant 68 y.o. Caucasian male in NAD. AAO x 3.  Vascular Examination: Pedal pulses are faintly palpable, decreased pedal hair growth. Capillary refill intact to the digits.  No erythema or edema  Dermatological Examination: Ulceration present right subfirst metatarsal head, loosely adhered scab present.  Stable callus present distal first toe right foot     Wound Location: right foot subfirst metatarsal head There is a moderate amount of devitalized tissue present in the wound.  Surrounding hyperkeratotic tissue, nonviable skin, subcutaneous tissue, muscle fascia Predebridement Wound Measurement: 0.6 x 0.6 x 0.3 cm. Postdebridement Wound Measurement: 0.6 x 0.6 x 1.0 cm. Wound Base: Mixed Granular/Fibrotic Peri-wound: Calloused Exudate: Scant/small amount Serosanguinous exudate Blood Loss during debridement: 2 cc('s). Sign(s) of clinical bacterial infection: No erythema, edema, significant drainage, malodor present.  Does have significant depth, probes at least to joint capsule  Musculoskeletal Examination: Prior right second toe amputation.  Hammertoe contractures of lesser digits  Neurological Examination: Light touch sensation decreased to toes, protective sensation absent  Labs:     No data to display  No results for input(s): GRAMSTAIN, LABORGA in the last 8760 hours.   Lab Results  Component Value Date   WBC 9.3 10/28/2023   HGB 11.0 (L) 10/28/2023   HCT 32.2 (L) 10/28/2023   MCV 88.5 10/28/2023   PLT 341 10/28/2023     VAS US  ABI WITH/WO TBI Result Date: 11/10/2023  LOWER EXTREMITY DOPPLER STUDY Patient Name:  Aaron Jimenez Aaron Jimenez  Date of Exam:   11/10/2023 Medical Rec #: [id number]     Accession #:    [id number] Date of Birth: October 22, 1955     Patient  Gender: M Patient Age:   26 years Exam Location:  [place] Procedure:      VAS US  ABI WITH/WO TBI Referring Phys: Aaron Jimenez --------------------------------------------------------------------------------  Indications: Claudication, and peripheral artery disease. High Risk Factors: Hypertension, hyperlipidemia, past history of smoking.  Performing Technologist: Aaron Jimenez  Examination Guidelines: A complete evaluation includes at minimum, Doppler waveform signals and systolic blood pressure reading at the level of bilateral brachial, anterior tibial, and posterior tibial arteries, when vessel segments are accessible. Bilateral testing is considered an integral part of a complete examination. Photoelectric Plethysmograph (PPG) waveforms and toe systolic pressure readings are included as required and additional duplex testing as needed. Limited examinations for reoccurring indications may be performed as noted.  ABI Findings: +---------+------------------+-----+-----------+--------+ Right    Rt Pressure (mmHg)IndexWaveform   Comment  +---------+------------------+-----+-----------+--------+ Brachial 178                                        +---------+------------------+-----+-----------+--------+ PTA      162               0.91 monophasic          +---------+------------------+-----+-----------+--------+ DP       145               0.81 multiphasic         +---------+------------------+-----+-----------+--------+ Great Toe135               0.76 Normal              +---------+------------------+-----+-----------+--------+ +---------+------------------+-----+-----------+-------+ Left     Lt Pressure (mmHg)IndexWaveform   Comment +---------+------------------+-----+-----------+-------+ Brachial 168                                       +---------+------------------+-----+-----------+-------+ PTA      141               0.79 multiphasic         +---------+------------------+-----+-----------+-------+ DP       138               0.78 multiphasic        +---------+------------------+-----+-----------+-------+ Aaron Jimenez               0.63 Abnormal           +---------+------------------+-----+-----------+-------+ +-------+-----------+-----------+------------+------------+ ABI/TBIToday's ABIToday's TBIPrevious ABIPrevious TBI +-------+-----------+-----------+------------+------------+ Right  0.91       0.76       0.68        0.57         +-------+-----------+-----------+------------+------------+ Left   0.79       0.63       0.51                     +-------+-----------+-----------+------------+------------+  Summary: Right: Resting right ankle-brachial index indicates mild right lower extremity arterial disease. The right toe-brachial index is normal.  Left: Resting left ankle-brachial index indicates moderate left lower extremity arterial disease. The left toe-brachial index is abnormal.  *See table(s) above for measurements and observations.  Electronically signed by Lamar Fitch MD on 11/10/2023 at 1:17:20 PM.    Final    Arterial duplex ultrasound 11/10/23 Current ABI: 0.91 & 0.79   Performing Technologist: Aaron Jimenez     Examination Guidelines: A complete evaluation includes B-mode imaging,  spectral  Doppler, color Doppler, and power Doppler as needed of all accessible  portions  of each vessel. Bilateral testing is considered an integral part of a  complete  examination. Limited examinations for reoccurring indications may be  performed  as noted.       +----------+--------+-----+---------------+----------+--------+  RIGHT    PSV cm/sRatioStenosis       Waveform  Comments  +----------+--------+-----+---------------+----------+--------+  CFA Distal147                         triphasic           +----------+--------+-----+---------------+----------+--------+  DFA      114                          biphasic            +----------+--------+-----+---------------+----------+--------+  SFA Prox  67                          biphasic            +----------+--------+-----+---------------+----------+--------+  SFA Mid   264          50-74% stenosisbiphasic            +----------+--------+-----+---------------+----------+--------+  SFA Distal151                         biphasic            +----------+--------+-----+---------------+----------+--------+  POP Mid   87                          biphasic            +----------+--------+-----+---------------+----------+--------+  TP Trunk  46                          biphasic            +----------+--------+-----+---------------+----------+--------+  PTA Mid   17                          monophasic          +----------+--------+-----+---------------+----------+--------+  PERO Mid  48                          monophasic          +----------+--------+-----+---------------+----------+--------+  DP       47                          monophasic          +----------+--------+-----+---------------+----------+--------+        +----------+--------+-----+---------------+----------+--------+  LEFT     PSV cm/sRatioStenosis       Waveform  Comments  +----------+--------+-----+---------------+----------+--------+  CFA Distal116                         triphasic           +----------+--------+-----+---------------+----------+--------+  DFA      68                          triphasic           +----------+--------+-----+---------------+----------+--------+  SFA Prox  68                          biphasic            +----------+--------+-----+---------------+----------+--------+  SFA Mid   205          50-74% stenosisbiphasic            +----------+--------+-----+---------------+----------+--------+  SFA Distal124                          biphasic            +----------+--------+-----+---------------+----------+--------+  POP Mid   152                         biphasic            +----------+--------+-----+---------------+----------+--------+  TP Trunk  59                          biphasic            +----------+--------+-----+---------------+----------+--------+  PTA Mid   90                          biphasic            +----------+--------+-----+---------------+----------+--------+  PERO Mid  95                          triphasic           +----------+--------+-----+---------------+----------+--------+  DP       42                          monophasic          +----------+--------+-----+---------------+----------+--------+        Summary:  Right: 50-74% stenosis noted in the superficial femoral artery.   Left: 50-74% stenosis noted in the superficial femoral artery.     See table(s) above for measurements and observations.      Electronically signed by Lamar Fitch MD on 11/10/2023 at 1:18:16 PM.   Radiographs right foot 12/12/2023:  Prior right second toe amputation. Distal phalanx of first toe chronic appearing cortical erosion of the distal medial tuft present, appearance consistent with chronic appearing osteomyelitis. Subfirst metatarsal head region associated with the current ulceration no obvious osteolysis or cortical erosion however there is possible decrease in density of the tibial sesamoid  No results found.   Assessment:  1. Idiopathic neuropathy (Primary)   2. Chronic osteomyelitis (HCC) -Awaiting wound culture at time of examination - CBC with Differential - C-reactive protein - Sedimentation Rate - MR FOOT RIGHT WO CONTRAST; Future  3. Ulcer of right foot with necrosis of muscle (HCC)   4. PAD (peripheral artery disease) -History of right side intervention 09/01/2023 -She recent ABI   Plan:  -  Orders on today's visit:  Orders Placed This  Encounter  Procedures   MR FOOT RIGHT WO CONTRAST    Standing Status:   Future    Expiration Date:   12/18/2024    What is the patient's sedation requirement?:   No Sedation    Does the patient have a pacemaker or implanted devices?:   No    Preferred imaging location?:   [facility]   CBC with Differential   C-reactive protein   Sedimentation Rate    -Patient was evaluated and treated and all questions answered.  -Patient/POA/Family member educated on diagnosis and treatment plan of routine ulcer debridement/wound care.  -Ulceration debridement achieved utilizing sharp excisional debridement with sterile scalpel blade and sterile currette.. Type/amount of devitalized tissue removed: nonviable hyperkeratosis, nonviable fibrotic  subcutaneous tissue and muscle fascia -Today's ulcer size post-debridement: 0.6 x 0.6 x 1.0 cm. -Ulceration cleansed with wound cleanser. Ulceration packed with Betadine wet-to-dry gauze secured with light dressing. -Wound responded well to today's debridement. -Patient risk factors affecting healing of ulcer: neuropathy, history of foot/leg ulcer, PAD with intermittent claudication -[facility] Jimenez given written instructions on daily wound care for right subfirst metatarsal head ulceration. -Continue course of oral doxycycline  until wound cultures finalized -Frequency of debridements needed to achieve healing: weekly to biweekly -Re-ordered MRI due to concern for osteomyelitis due to probing of ulcer towards first MPJ sesamoid lesion and chronic degenerative bone changes seen to the right first toe distal phalanx on radiographs -Labs ordered today: CBC, sed rate, CRP. -Continue to monitor for signs of infection -Did discuss if MRI suggestive of osteomyelitis may need surgical intervention.  Would consider vascular referral due to one-vessel runoff in this event as well. Return in about 1 week (around 12/26/2023) for Wound Care.  Ethan LITTIE Saddler, DPM       [place] LOCATION: [address] [place] [address]                   Office [phone]   [place] LOCATION: [address] [place], [place] [address] Office [phone]

## 2023-12-19 NOTE — Patient Instructions (Signed)

## 2023-12-20 ENCOUNTER — Ambulatory Visit: Payer: Self-pay | Admitting: Podiatry

## 2023-12-20 DIAGNOSIS — M8660 Other chronic osteomyelitis, unspecified site: Secondary | ICD-10-CM

## 2023-12-20 LAB — CBC WITH DIFFERENTIAL/PLATELET
Basophils Absolute: 0.1 x10E3/uL (ref 0.0–0.2)
Basos: 1 %
EOS (ABSOLUTE): 0 x10E3/uL (ref 0.0–0.4)
Eos: 0 %
Hematocrit: 38.5 % (ref 37.5–51.0)
Hemoglobin: 12.3 g/dL — ABNORMAL LOW (ref 13.0–17.7)
Immature Grans (Abs): 0.1 x10E3/uL (ref 0.0–0.1)
Immature Granulocytes: 1 %
Lymphocytes Absolute: 2.5 x10E3/uL (ref 0.7–3.1)
Lymphs: 24 %
MCH: 28.6 pg (ref 26.6–33.0)
MCHC: 31.9 g/dL (ref 31.5–35.7)
MCV: 90 fL (ref 79–97)
Monocytes Absolute: 0.8 x10E3/uL (ref 0.1–0.9)
Monocytes: 7 %
Neutrophils Absolute: 7 x10E3/uL (ref 1.4–7.0)
Neutrophils: 67 %
Platelets: 427 x10E3/uL (ref 150–450)
RBC: 4.3 x10E6/uL (ref 4.14–5.80)
RDW: 12.4 % (ref 11.6–15.4)
WBC: 10.4 x10E3/uL (ref 3.4–10.8)

## 2023-12-20 LAB — SEDIMENTATION RATE: Sed Rate: 7 mm/h (ref 0–30)

## 2023-12-20 LAB — C-REACTIVE PROTEIN: CRP: <1 mg/L (ref 0–10)

## 2023-12-20 MED ORDER — PENICILLIN V POTASSIUM 500 MG PO TABS: 500.0000 mg | ORAL_TABLET | Freq: Four times a day (QID) | ORAL | 0 refills | Status: AC

## 2023-12-22 ENCOUNTER — Inpatient Hospital Stay (HOSPITAL_BASED_OUTPATIENT_CLINIC_OR_DEPARTMENT_OTHER): Admission: RE | Admit: 2023-12-22 | Discharge: 2023-12-22 | Attending: Podiatry | Admitting: Radiology

## 2023-12-22 DIAGNOSIS — M8660 Other chronic osteomyelitis, unspecified site: Secondary | ICD-10-CM | POA: Diagnosis not present

## 2023-12-27 ENCOUNTER — Ambulatory Visit: Admitting: Podiatry

## 2023-12-27 ENCOUNTER — Encounter: Payer: Self-pay | Admitting: Podiatry

## 2023-12-27 DIAGNOSIS — L97513 Non-pressure chronic ulcer of other part of right foot with necrosis of muscle: Secondary | ICD-10-CM | POA: Diagnosis not present

## 2023-12-27 NOTE — Patient Instructions (Signed)
 Instructions for Wound Care  The most important step to healing a foot wound is to reduce the pressure on your foot - it is extremely important to stay off your foot as much as possible and wear the shoe/boot as instructed.  Cleanse your foot with saline wash or warm soapy water  (dial antibacterial soap or similar).  Blot dry.  Apply prescribed medication to your wound and cover with gauze and a bandage.  May hold bandage in place with Coban (self sticky wrap), Ace bandage or tape.  You may find dressing supplies at your local [employer], [employer], drug store or medical supply store.  Monitor for any signs/symptoms of infection. If there is any increase in redness, red streaks, increase in drainage, warmth to your foot please give us  a call. Also, if you start to run a fever or have flu-like symptoms that can also be a sign of infection. Call the office immediately if any occur or go directly to the emergency room.   If you have any questions, please feel free to give us  a call at [phone] or if you are on MyChart you can always send me a message if needed.   Look for felt dancers pads on [employer] to offload the wound

## 2023-12-27 NOTE — Progress Notes (Unsigned)
 Subjective:  Patient ID: Aaron Jimenez, male    DOB: 07-12-1955,  MRN: [id number]  68 y.o. male is seen today for evaluation of right sub first metatarsal head ulceration.  He is accompanied by his wife.  50-pack-year tobacco history.  Quit smoking approximately 10 years ago.  Does have peripheral neuropathy and PAD of bilateral lower extremities.  Had a right lower extremity angiogram by Dr. Wadie on September 01, 2023, noted to have one-vessel runoff. MRI has been obtained since last visit.  Prescribed wound care regimen: Betadine Solution dressing daily. Area has been packed lightly daily.  Patient is using Darco Shoe.  Patient relates wound has improved slightly  Patient The patient is having no constitutional symptoms, denying fever, chills, anorexia, or weight loss.     Current Outpatient Medications on File Prior to Visit  Medication Sig Dispense Refill   aspirin  EC 81 MG tablet Take 1 tablet (81 mg total) by mouth daily. Swallow whole.     atorvastatin  (LIPITOR) 80 MG tablet Take 1 tablet (80 mg total) by mouth daily. 90 tablet 3   clopidogrel  (PLAVIX ) 75 MG tablet Take 1 tablet (75 mg total) by mouth daily with breakfast. 90 tablet 1   ezetimibe  (ZETIA ) 10 MG tablet Take 1 tablet (10 mg total) by mouth daily. 90 tablet 3   FLUoxetine  (PROZAC ) 40 MG capsule Take 80 mg by mouth in the morning.     lisinopril  (ZESTRIL ) 10 MG tablet Take 10 mg by mouth daily.     pantoprazole  (PROTONIX ) 40 MG tablet Take 1 tablet (40 mg total) by mouth daily. 90 tablet 0   sertraline  (ZOLOFT ) 100 MG tablet Take 100 mg by mouth in the morning and at bedtime.     traMADol  (ULTRAM ) 50 MG tablet Take 50 mg by mouth 4 (four) times daily as needed for pain.     No current facility-administered medications on file prior to visit.     Allergies  Allergen Reactions   Bee Venom Anaphylaxis   Mixed Ragweed Shortness Of Breath     Objective:  Physical Exam: There were no vitals filed for this visit.    Aaron Jimenez is a pleasant 68 y.o. Caucasian male in NAD. AAO x 3.  Vascular Examination: Pedal pulses are faintly palpable, decreased pedal hair growth. Capillary refill intact to the digits.  No erythema or edema  Dermatological Examination: Ulceration present right subfirst metatarsal head, surrounding hyperkeratotic tissue present. Stable callus present distal first toe right foot    Wound Location: right foot subfirst metatarsal head There is a moderate amount of devitalized tissue present in the wound.  Surrounding hyperkeratotic tissue, nonviable skin, subcutaneous tissue, muscle fascia Predebridement Wound Measurement: 0.4 x 0.4 x 0.7 cm. Postdebridement Wound Measurement: 0.7 x 0.7 x 1.0 cm. Wound Base: Mixed Granular/Fibrotic Peri-wound: Calloused Exudate: small to moderate amount Serosanguinous exudate Blood Loss during debridement: 2 cc('s). Sign(s) of clinical bacterial infection: No erythema, edema, significant drainage, malodor present.  Does have significant depth, does seem to have increased tissue to overlying capsule from previous  Musculoskeletal Examination: Prior right second toe amputation.  Hammertoe contractures of lesser digits  Neurological Examination: Light touch sensation decreased to toes, protective sensation absent  Labs:     No data to display             No results for input(s): GRAMSTAIN, LABORGA in the last 8760 hours.   Lab Results  Component Value Date   WBC 10.4 12/19/2023  HGB 12.3 (L) 12/19/2023   HCT 38.5 12/19/2023   MCV 90 12/19/2023   PLT 427 12/19/2023     VAS US  ABI WITH/WO TBI Result Date: 11/10/2023  LOWER EXTREMITY DOPPLER STUDY Patient Name:  Aaron Jimenez [facility]  Date of Exam:   11/10/2023 Medical Rec #: [id number]     Accession #:    [id number] Date of Birth: 1955-01-21     Patient Gender: M Patient Age:   9 years Exam Location:  [place] Procedure:      VAS US  ABI WITH/WO TBI Referring Phys: DORN BERRY  --------------------------------------------------------------------------------  Indications: Claudication, and peripheral artery disease. High Risk Factors: Hypertension, hyperlipidemia, past history of smoking.  Performing Technologist: Saddie Chimes  Examination Guidelines: A complete evaluation includes at minimum, Doppler waveform signals and systolic blood pressure reading at the level of bilateral brachial, anterior tibial, and posterior tibial arteries, when vessel segments are accessible. Bilateral testing is considered an integral part of a complete examination. Photoelectric Plethysmograph (PPG) waveforms and toe systolic pressure readings are included as required and additional duplex testing as needed. Limited examinations for reoccurring indications may be performed as noted.  ABI Findings: +---------+------------------+-----+-----------+--------+ Right    Rt Pressure (mmHg)IndexWaveform   Comment  +---------+------------------+-----+-----------+--------+ Brachial 178                                        +---------+------------------+-----+-----------+--------+ PTA      162               0.91 monophasic          +---------+------------------+-----+-----------+--------+ DP       145               0.81 multiphasic         +---------+------------------+-----+-----------+--------+ Great Toe135               0.76 Normal              +---------+------------------+-----+-----------+--------+ +---------+------------------+-----+-----------+-------+ Left     Lt Pressure (mmHg)IndexWaveform   Comment +---------+------------------+-----+-----------+-------+ Brachial 168                                       +---------+------------------+-----+-----------+-------+ PTA      141               0.79 multiphasic        +---------+------------------+-----+-----------+-------+ DP       138               0.78 multiphasic         +---------+------------------+-----+-----------+-------+ [name]               0.63 Abnormal           +---------+------------------+-----+-----------+-------+ +-------+-----------+-----------+------------+------------+ ABI/TBIToday's ABIToday's TBIPrevious ABIPrevious TBI +-------+-----------+-----------+------------+------------+ Right  0.91       0.76       0.68        0.57         +-------+-----------+-----------+------------+------------+ Left   0.79       0.63       0.51                     +-------+-----------+-----------+------------+------------+  Summary: Right: Resting right ankle-brachial index indicates mild right lower extremity arterial disease. The right toe-brachial index is normal.  Left: Resting left ankle-brachial index indicates moderate left lower extremity arterial disease. The left toe-brachial index is abnormal.  *See table(s) above for measurements and observations.  Electronically signed by Lamar Fitch MD on 11/10/2023 at 1:17:20 PM.    Final    Arterial duplex ultrasound 11/10/23 Current ABI: 0.91 & 0.79   Performing Technologist: Saddie Chimes     Examination Guidelines: A complete evaluation includes B-mode imaging,  spectral  Doppler, color Doppler, and power Doppler as needed of all accessible  portions  of each vessel. Bilateral testing is considered an integral part of a  complete  examination. Limited examinations for reoccurring indications may be  performed  as noted.       +----------+--------+-----+---------------+----------+--------+  RIGHT    PSV cm/sRatioStenosis       Waveform  Comments  +----------+--------+-----+---------------+----------+--------+  CFA Distal147                         triphasic           +----------+--------+-----+---------------+----------+--------+  DFA      114                         biphasic            +----------+--------+-----+---------------+----------+--------+   SFA Prox  67                          biphasic            +----------+--------+-----+---------------+----------+--------+  SFA Mid   264          50-74% stenosisbiphasic            +----------+--------+-----+---------------+----------+--------+  SFA Distal151                         biphasic            +----------+--------+-----+---------------+----------+--------+  POP Mid   87                          biphasic            +----------+--------+-----+---------------+----------+--------+  TP Trunk  46                          biphasic            +----------+--------+-----+---------------+----------+--------+  PTA Mid   17                          monophasic          +----------+--------+-----+---------------+----------+--------+  PERO Mid  48                          monophasic          +----------+--------+-----+---------------+----------+--------+  DP       47                          monophasic          +----------+--------+-----+---------------+----------+--------+        +----------+--------+-----+---------------+----------+--------+  LEFT     PSV cm/sRatioStenosis       Waveform  Comments  +----------+--------+-----+---------------+----------+--------+  CFA [place]883  triphasic           +----------+--------+-----+---------------+----------+--------+  DFA      68                          triphasic           +----------+--------+-----+---------------+----------+--------+  SFA Prox  68                          biphasic            +----------+--------+-----+---------------+----------+--------+  SFA Mid   205          50-74% stenosisbiphasic            +----------+--------+-----+---------------+----------+--------+  SFA Distal124                         biphasic            +----------+--------+-----+---------------+----------+--------+  POP Mid   152                          biphasic            +----------+--------+-----+---------------+----------+--------+  TP Trunk  59                          biphasic            +----------+--------+-----+---------------+----------+--------+  PTA Mid   90                          biphasic            +----------+--------+-----+---------------+----------+--------+  PERO Mid  95                          triphasic           +----------+--------+-----+---------------+----------+--------+  DP       42                          monophasic          +----------+--------+-----+---------------+----------+--------+        Summary:  Right: 50-74% stenosis noted in the superficial femoral artery.   Left: 50-74% stenosis noted in the superficial femoral artery.     See table(s) above for measurements and observations.      Electronically signed by Lamar Fitch MD on 11/10/2023 at 1:18:16 PM.   Radiographs right foot 12/12/2023:  Prior right second toe amputation. Distal phalanx of first toe chronic appearing cortical erosion of the distal medial tuft present, appearance consistent with chronic appearing osteomyelitis. Subfirst metatarsal head region associated with the current ulceration no obvious osteolysis or cortical erosion however there is possible decrease in density of the tibial sesamoid  MR FOOT RIGHT WO CONTRAST Result Date: 12/22/2023 CLINICAL DATA:  Pain and swelling of the right great toe. EXAM: MRI OF THE RIGHT FOREFOOT WITHOUT CONTRAST TECHNIQUE: Multiplanar, multisequence MR imaging of the right foot was performed. No intravenous contrast was administered. COMPARISON:  Radiographs 12/12/2023 FINDINGS: Examination limited by motion artifact and lack of IV contrast. Areas of tiny metallic artifact also noted. Evidence of prior second toe amputation. No MR findings suspicious for acute osteomyelitis or septic arthritis. Small focus of signal abnormality in the first  metatarsal head is likely an old bone  infarct. The sesamoid bones are intact. Subcutaneous soft tissue swelling/edema and scattered fluid suggesting cellulitis. No focal fluid collection to suggest a drainable soft tissue abscess. Mild diffuse myositis without findings to suggest pyomyositis. IMPRESSION: 1. No MR findings suspicious for acute osteomyelitis or septic arthritis. 2. Subcutaneous soft tissue swelling/edema and scattered fluid suggesting cellulitis. No focal fluid collection to suggest a drainable soft tissue abscess. 3. Mild diffuse myositis without findings to suggest pyomyositis. Electronically Signed   By: MYRTIS Stammer M.D.   On: 12/22/2023 22:44     Assessment:  1. Idiopathic neuropathy (Primary)   2. 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 PAD (peripheral artery disease) -History of right side intervention 09/01/2023 -See recent ABI   Plan:  -Orders on today's visit:  No orders of the defined types were placed in this encounter.   -Patient was evaluated and treated and all questions answered.  -Patient/POA/Family member educated on diagnosis and treatment plan of routine ulcer debridement/wound care.  -Ulceration debridement achieved utilizing sharp excisional debridement with sterile scalpel blade and sterile currette.. Type/amount of devitalized tissue removed: nonviable hyperkeratosis, nonviable fibrotic  subcutaneous tissue and muscle fascia -Today's ulcer size post-debridement: 0.7 x 0.7 x 1.0 cm. -Ulceration cleansed with wound cleanser. Ulceration lightly packed with Betadine wet-to-dry gauze secured with light dressing. -Will consider collagen once ulceration granulates in slightly more. -Wound responded well to today's debridement. -Patient risk factors affecting healing of ulcer: neuropathy, history of foot/leg ulcer, PAD with intermittent claudication, prior second toe amputation. -[name] given written instructions on daily wound care for  right subfirst metatarsal head ulceration. -Frequency of debridements needed to achieve healing: weekly to biweekly -MRI reviewed with patient, some motion artifact however no signs of osteomyelitis appreciated -Labs reviewed: no significant elevation of inflammatory markers to suggest active infection. -Continue to monitor for signs of infection -Return precautions given, contact office if signs of infection develop Return in about 2 weeks (around 01/10/2024) for Wound Care.  Ethan LITTIE Saddler, DPM      [place] LOCATION: [address] [place] [address]                   Office [phone]   [name] LOCATION: [address], [address] [place], [place] [address] Office [phone]

## 2024-01-02 ENCOUNTER — Ambulatory Visit: Payer: Self-pay | Admitting: Cardiology

## 2024-01-02 ENCOUNTER — Ambulatory Visit: Attending: Cardiology

## 2024-01-02 DIAGNOSIS — I7121 Aneurysm of the ascending aorta, without rupture: Secondary | ICD-10-CM

## 2024-01-02 DIAGNOSIS — I2510 Atherosclerotic heart disease of native coronary artery without angina pectoris: Secondary | ICD-10-CM

## 2024-01-02 LAB — ECHOCARDIOGRAM COMPLETE
AR max vel: 1.57 cm2
AV Area VTI: 1.6 cm2
AV Area mean vel: 1.73 cm2
AV Mean grad: 10.4 mmHg
AV Peak grad: 22.2 mmHg
AV Vena cont: 0.5 cm
Ao pk vel: 2.35 m/s
Area-P 1/2: 2.05 cm2
MV M vel: 4.12 m/s
MV Peak grad: 67.9 mmHg
MV VTI: 2.05 cm2
P 1/2 time: 620 ms
S' Lateral: 3.6 cm

## 2024-01-03 ENCOUNTER — Telehealth: Payer: Self-pay

## 2024-01-03 NOTE — Telephone Encounter (Signed)
 Echo Results reviewed with pt's spouse per DPR with Dr. Krasowski.  Pt verbalized understanding and had no additional questions. Routed to PCP

## 2024-01-11 ENCOUNTER — Ambulatory Visit: Admitting: Podiatry

## 2024-01-11 DIAGNOSIS — L97512 Non-pressure chronic ulcer of other part of right foot with fat layer exposed: Secondary | ICD-10-CM | POA: Diagnosis not present

## 2024-01-11 NOTE — Progress Notes (Signed)
 "   Chief Complaint  Patient presents with   Wound Check    Right sub-met 1 ulcer. It is closed today, but the callus is really thick.  Completed Doxy that was sent in.  ASA and Plavix    Discussed the use of AI scribe software for clinical note transcription with the patient, who gave verbal consent to proceed.  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  He is wearing his surgical shoe.  He would like to stop wearing it.  A family member is with him today.  He developed a right foot wound that initially opened approximately two weeks prior to his right toe amputation in December 2024. The wound improved postoperatively but subsequently reopened. He has been performing daily wound care at home, including Betadine and a silver-based dressing, with daily dressing changes. He completed an extended course of antibiotics. He has been wearing a surgical shoe continuously since the amputation. Due to underlying neuropathy, he is unable to feel pain in the wound area. He and his caregiver report that the wound appears better. He is able to visualize the wound and has been taking photographs to monitor healing. He currently lacks sufficient dressing materials at home and has had difficulty obtaining supplies. He denies swelling.  He continues to experience persistent symptoms related to a torn ligament in both feet since the initial injury.  His family member pointed to the bony prominence at the fifth metatarsal base/styloid process as the area of the torn ligament.  Patient is not diabetic   Past Medical History:  Diagnosis Date   Anxiety    Ascending aortic aneurysm (HCC) 43 mm 08/19/2021   Bilateral hearing loss 04/17/2020   Bipolar II disorder, most recent episode major depressive (HCC) 04/17/2020   Cancer (HCC)    tumor removed off lt testicle    Carotid artery disease 11/24/2023   Carotid artery disease     Carpal tunnel syndrome 04/17/2020   Cervical disc disease 04/17/2020   Mar 09, 2013 Entered By: HILDY NEWPORT Comment: sp fusion at c567     Chronic post-traumatic stress disorder 04/17/2020   Chronic ulcer of toe (HCC) 04/17/2020   Claudication in peripheral vascular disease 09/01/2023   Claudication     Coronary artery disease mild nonobstructive based on coronary CT angio 05/02/2023   Depression    Dyslipidemia 04/17/2020   Elevated blood-pressure reading without diagnosis of hypertension 04/17/2020   Essential hypertension 04/17/2020   Gastroesophageal reflux disease 04/17/2020   Mood disorder 04/17/2020   May 06, 2015 Entered By: GARNER RICHARDSON HERO Comment: MDD moderate recurrent w/o PF.     Neuropathy 04/17/2020   Peripheral arterial disease 07/20/2023   Peripheral arterial disease     Peroneal tendinitis 04/17/2020   PONV (postoperative nausea and vomiting)    Restless legs syndrome 04/17/2020   Soft tissue mass 04/17/2020   Syncope and collapse 06/16/2021   Testicular cancer (HCC)    Tobacco use disorder 04/17/2020   Traumatic brain injury (HCC) 04/17/2020   Past Surgical History:  Procedure Laterality Date   ANTERIOR CERVICAL DECOMP/DISCECTOMY FUSION N/A 07/27/2012   Procedure: ANTERIOR CERVICAL DECOMPRESSION/DISCECTOMY FUSION CERVICALSIX-SEVEN1 LEVEL/HARDWARE REMOVAL CERVICAL FIVE-SIX;  Surgeon: Alm GORMAN Molt, MD;  Location: [facility];  Service: Neurosurgery;  Laterality: N/A;  Anterior Cervical Decompression/Discectomy Fusion, Cervical Six-Seven Fusion, Removal of hardware at Cervical Five-Six  BACK SURGERY     neck fusion   KNEE ARTHROSCOPY     Left testicle removed  Left    LOWER EXTREMITY ANGIOGRAPHY N/A 09/01/2023   Procedure: Lower Extremity Angiography;  Surgeon: Court Dorn PARAS, MD;  Location: [facility] INVASIVE CV LAB;  Service: Cardiovascular;  Laterality: N/A;   LOWER EXTREMITY ANGIOGRAPHY N/A  10/27/2023   Procedure: Lower Extremity Angiography;  Surgeon: Court Dorn PARAS, MD;  Location: [facility] INVASIVE CV LAB;  Service: Cardiovascular;  Laterality: N/A;   LOWER EXTREMITY INTERVENTION  09/01/2023   Procedure: LOWER EXTREMITY INTERVENTION;  Surgeon: Court Dorn PARAS, MD;  Location: [facility] INVASIVE CV LAB;  Service: Cardiovascular;;   LOWER EXTREMITY INTERVENTION  10/27/2023   Procedure: LOWER EXTREMITY INTERVENTION;  Surgeon: Court Dorn PARAS, MD;  Location: [facility] INVASIVE CV LAB;  Service: Cardiovascular;;   NOSE SURGERY     2020   TOE AMPUTATION Right 04/18/2020   index toe   Allergies[1]  Physical Exam SKIN: There is an ulceration right submet 1 that is covered by callus on exam today.  There appears to be a white sock fuzz stuck in the wound as well.  The photos are pre and postdebridement below.  There is surrounding hyperkeratosis.  Post debridement measurements are 0.4 x 0.3 x 0.3 cm.  This is full-thickness to subcutaneous tissue.  No clinical signs of infection are noted.  No drainage was noted.  No necrosis is noted.  There is no malodor.       Results Wound debridement and dressing change, right foot Chronic ulcer of right toe with skin breakdown evaluated. Wound shaved to assess depth and tissue status. Wound clean, no signs of infection. Dressing applied.    Assessment/Plan of Care: 1. Ulcer of right foot, with fat layer exposed (HCC)    Assessment & Plan Full-thickness ulceration right submet 1 Chronic ulcer at the right toe amputation site is improving (according to the patient, as he is typically seen by Dr. Lamount) with current wound care. No signs of infection.  Neuropathy impairs pain perception. Dressing supplies are insufficient, requiring intervention to maintain the wound care regimen. - Continued daily wound care with Betadine and silver dressing as previously instructed. - Instructed him to maintain wound cleanliness and adhere to the current care regimen. -  Advised continued use of the surgical shoe until complete wound healing. - Arranged for a third-party supplier to process and deliver wound dressing supplies through insurance.  Wound supply order faxed to [employer]  today - Instructed him to monitor for contact from the supplier and respond to finalize the order; advised to call the office if not contacted by Monday. - Obtained a new photograph of the wound for the medical record. - Advised that Ace wrap is optional due to minimal swelling, but may be used for additional protection if desired.  Sprain of ligament of foot Ongoing issue may require further evaluation.  This was not evaluated today. - Discussed the need for possible further evaluation of the torn ligament due to ongoing symptoms.  Follow-up with Dr. Lamount within the next 1-2 weeks.   Awanda CHARM Imperial, DPM, FACFAS [facility]     [address], [place] [address]  Office [phone]  Fax [phone]      [1]  Allergies Allergen Reactions   Bee Venom Anaphylaxis   Mixed Ragweed Shortness Of Breath   "

## 2024-01-31 ENCOUNTER — Ambulatory Visit: Admitting: Podiatry

## 2024-01-31 ENCOUNTER — Encounter: Payer: Self-pay | Admitting: Podiatry

## 2024-01-31 DIAGNOSIS — L97513 Non-pressure chronic ulcer of other part of right foot with necrosis of muscle: Secondary | ICD-10-CM | POA: Diagnosis not present

## 2024-01-31 DIAGNOSIS — I739 Peripheral vascular disease, unspecified: Secondary | ICD-10-CM | POA: Diagnosis not present

## 2024-01-31 DIAGNOSIS — L97512 Non-pressure chronic ulcer of other part of right foot with fat layer exposed: Secondary | ICD-10-CM

## 2024-01-31 DIAGNOSIS — G609 Hereditary and idiopathic neuropathy, unspecified: Secondary | ICD-10-CM

## 2024-01-31 NOTE — Progress Notes (Signed)
 "  Subjective:  Patient ID: Aaron Jimenez, male    DOB: May 06, 1955,  MRN: [id number]  69 y.o. male is seen today for evaluation of right sub first metatarsal head ulceration.  He is accompanied by his wife.  50-pack-year tobacco history.  Quit smoking approximately 10 years ago.  Does have peripheral neuropathy and PAD of bilateral lower extremities.  Had a right lower extremity angiogram by Dr. Wadie on September 01, 2023, noted to have one-vessel runoff.  Wife has been applying Betadine dressing as instructed.  Prescribed wound care regimen: Betadine Solution dressing daily. Area has been packed lightly daily.  Patient is using Darco Shoe.  Patient relates wound has improved slightly  Patient The patient is having no constitutional symptoms, denying fever, chills, anorexia, or weight loss.     Current Outpatient Medications on File Prior to Visit  Medication Sig Dispense Refill   aspirin  EC 81 MG tablet Take 1 tablet (81 mg total) by mouth daily. Swallow whole.     atorvastatin  (LIPITOR) 80 MG tablet Take 1 tablet (80 mg total) by mouth daily. 90 tablet 3   clopidogrel  (PLAVIX ) 75 MG tablet Take 1 tablet (75 mg total) by mouth daily with breakfast. 90 tablet 1   ezetimibe  (ZETIA ) 10 MG tablet Take 1 tablet (10 mg total) by mouth daily. 90 tablet 3   FLUoxetine  (PROZAC ) 40 MG capsule Take 80 mg by mouth in the morning.     lisinopril  (ZESTRIL ) 10 MG tablet Take 10 mg by mouth daily.     pantoprazole  (PROTONIX ) 40 MG tablet Take 1 tablet (40 mg total) by mouth daily. 90 tablet 0   sertraline  (ZOLOFT ) 100 MG tablet Take 100 mg by mouth in the morning and at bedtime.     traMADol  (ULTRAM ) 50 MG tablet Take 50 mg by mouth 4 (four) times daily as needed for pain.     No current facility-administered medications on file prior to visit.     Allergies  Allergen Reactions   Bee Venom Anaphylaxis   Mixed Ragweed Shortness Of Breath     Objective:  Physical Exam: There were no vitals filed for  this visit.   Aaron Jimenez is a pleasant 69 y.o. Caucasian male in NAD. AAO x 3.  Vascular Examination: Pedal pulses are faintly palpable, decreased pedal hair growth. Capillary refill intact to the digits.  No erythema or edema  Dermatological Examination: Ulceration present right subfirst metatarsal head, surrounding hyperkeratotic tissue present. Stable callus present distal first toe right foot       Wound Location: right foot subfirst metatarsal head There is a considerable amount of devitalized tissue present in the wound.  Surrounding excessive hyperkeratotic tissue, nonviable skin, subcutaneous tissue, muscle fascia Predebridement Wound Measurement: 0.2 x 0.2 x 0.3 cm. Postdebridement Wound Measurement: 0.5 x 0.5 x 0.5 cm. Wound Base: Mixed Granular/Fibrotic Peri-wound: Calloused Exudate: small to moderate amount Serosanguinous exudate Blood Loss during debridement: 2 cc('s). Sign(s) of clinical bacterial infection: No erythema, edema, significant drainage, malodor present.  Does have significant depth, does seem to have improved granulation and decreased depth from previous.  Musculoskeletal Examination: Prior right second toe amputation.  Hammertoe contractures of lesser digits  Neurological Examination: Light touch sensation decreased to toes, protective sensation absent  Labs:     No data to display             No results for input(s): GRAMSTAIN, LABORGA in the last 8760 hours.   Lab Results  Component Value  Date   WBC 10.4 12/19/2023   HGB 12.3 (L) 12/19/2023   HCT 38.5 12/19/2023   MCV 90 12/19/2023   PLT 427 12/19/2023     VAS US  ABI WITH/WO TBI Result Date: 11/10/2023  LOWER EXTREMITY DOPPLER STUDY Patient Name:  Aaron Jimenez  Date of Exam:   11/10/2023 Medical Rec #: [id number]     Accession #:    [id number] Date of Birth: [birth year]     Patient Gender: M Patient Age:   73 years Exam Location:  [facility] Procedure:      VAS US  ABI WITH/WO TBI  Referring Phys: Aaron Jimenez --------------------------------------------------------------------------------  Indications: Claudication, and peripheral artery disease. High Risk Factors: Hypertension, hyperlipidemia, past history of smoking.  Performing Technologist: Saddie Chimes  Examination Guidelines: A complete evaluation includes at minimum, Doppler waveform signals and systolic blood pressure reading at the level of bilateral brachial, anterior tibial, and posterior tibial arteries, when vessel segments are accessible. Bilateral testing is considered an integral part of a complete examination. Photoelectric Plethysmograph (PPG) waveforms and toe systolic pressure readings are included as required and additional duplex testing as needed. Limited examinations for reoccurring indications may be performed as noted.  ABI Findings: +---------+------------------+-----+-----------+--------+ Right    Rt Pressure (mmHg)IndexWaveform   Comment  +---------+------------------+-----+-----------+--------+ Brachial 178                                        +---------+------------------+-----+-----------+--------+ PTA      162               0.91 monophasic          +---------+------------------+-----+-----------+--------+ DP       145               0.81 multiphasic         +---------+------------------+-----+-----------+--------+ Great Toe135               0.76 Normal              +---------+------------------+-----+-----------+--------+ +---------+------------------+-----+-----------+-------+ Left     Lt Pressure (mmHg)IndexWaveform   Comment +---------+------------------+-----+-----------+-------+ Brachial 168                                       +---------+------------------+-----+-----------+-------+ PTA      141               0.79 multiphasic        +---------+------------------+-----+-----------+-------+ DP       138               0.78 multiphasic         +---------+------------------+-----+-----------+-------+ [name]               0.63 Abnormal           +---------+------------------+-----+-----------+-------+ +-------+-----------+-----------+------------+------------+ ABI/TBIToday's ABIToday's TBIPrevious ABIPrevious TBI +-------+-----------+-----------+------------+------------+ Right  0.91       0.76       0.68        0.57         +-------+-----------+-----------+------------+------------+ Left   0.79       0.63       0.51                     +-------+-----------+-----------+------------+------------+  Summary: Right: Resting right ankle-brachial index indicates mild right lower extremity arterial  disease. The right toe-brachial index is normal.  Left: Resting left ankle-brachial index indicates moderate left lower extremity arterial disease. The left toe-brachial index is abnormal.  *See table(s) above for measurements and observations.  Electronically signed by Lamar Fitch MD on 11/10/2023 at 1:17:20 PM.    Final    Arterial duplex ultrasound 11/10/23 Current ABI: 0.91 & 0.79   Performing Technologist: Saddie Chimes     Examination Guidelines: A complete evaluation includes B-mode imaging,  spectral  Doppler, color Doppler, and power Doppler as needed of all accessible  portions  of each vessel. Bilateral testing is considered an integral part of a  complete  examination. Limited examinations for reoccurring indications may be  performed  as noted.       +----------+--------+-----+---------------+----------+--------+  RIGHT    PSV cm/sRatioStenosis       Waveform  Comments  +----------+--------+-----+---------------+----------+--------+  CFA Distal147                         triphasic           +----------+--------+-----+---------------+----------+--------+  DFA      114                         biphasic            +----------+--------+-----+---------------+----------+--------+   SFA Prox  67                          biphasic            +----------+--------+-----+---------------+----------+--------+  SFA Mid   264          50-74% stenosisbiphasic            +----------+--------+-----+---------------+----------+--------+  SFA Distal151                         biphasic            +----------+--------+-----+---------------+----------+--------+  POP Mid   87                          biphasic            +----------+--------+-----+---------------+----------+--------+  TP Trunk  46                          biphasic            +----------+--------+-----+---------------+----------+--------+  PTA Mid   17                          monophasic          +----------+--------+-----+---------------+----------+--------+  PERO Mid  48                          monophasic          +----------+--------+-----+---------------+----------+--------+  DP       47                          monophasic          +----------+--------+-----+---------------+----------+--------+        +----------+--------+-----+---------------+----------+--------+  LEFT     PSV cm/sRatioStenosis       Waveform  Comments  +----------+--------+-----+---------------+----------+--------+  CFA [id number]  triphasic           +----------+--------+-----+---------------+----------+--------+  DFA      68                          triphasic           +----------+--------+-----+---------------+----------+--------+  SFA Prox  68                          biphasic            +----------+--------+-----+---------------+----------+--------+  SFA Mid   205          50-74% stenosisbiphasic            +----------+--------+-----+---------------+----------+--------+  SFA Distal124                         biphasic            +----------+--------+-----+---------------+----------+--------+  POP Mid   152                          biphasic            +----------+--------+-----+---------------+----------+--------+  TP Trunk  59                          biphasic            +----------+--------+-----+---------------+----------+--------+  PTA Mid   90                          biphasic            +----------+--------+-----+---------------+----------+--------+  PERO Mid  95                          triphasic           +----------+--------+-----+---------------+----------+--------+  DP       42                          monophasic          +----------+--------+-----+---------------+----------+--------+        Summary:  Right: 50-74% stenosis noted in the superficial femoral artery.   Left: 50-74% stenosis noted in the superficial femoral artery.     See table(s) above for measurements and observations.      Electronically signed by Lamar Fitch MD on 11/10/2023 at 1:18:16 PM.   Radiographs right foot 12/12/2023:  Prior right second toe amputation. Distal phalanx of first toe chronic appearing cortical erosion of the distal medial tuft present, appearance consistent with chronic appearing osteomyelitis. Subfirst metatarsal head region associated with the current ulceration no obvious osteolysis or cortical erosion however there is possible decrease in density of the tibial sesamoid  No results found.    Assessment:  1. Idiopathic neuropathy (Primary)   2. 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 PAD (peripheral artery disease) -History of right side intervention 09/01/2023 -See recent ABI   Plan:  -Orders on today's visit:  No orders of the defined types were placed in this encounter.   -Patient was evaluated and treated and all questions answered.  -Patient/POA/Family member educated on diagnosis and treatment plan of routine ulcer debridement/wound care.  -Ulceration debridement achieved utilizing sharp excisional debridement with  sterile scalpel  blade and sterile currette.. Type/amount of devitalized tissue removed: nonviable hyperkeratosis, nonviable fibrotic  subcutaneous tissue and muscle fascia -Today's ulcer size post-debridement: 0.5 x 0.5 x 0.5 cm. -Ulceration cleansed with wound cleanser. Ulceration lightly packed with Prisma and foam bandage. -Wound responded well to today's debridement. -Patient risk factors affecting healing of ulcer: neuropathy, history of foot/leg ulcer, PAD with intermittent claudication, prior second toe amputation. -[facility] given written instructions on daily wound care for right subfirst metatarsal head ulceration. -Frequency of debridements needed to achieve healing: Every 2 to 3 weeks -Labs reviewed: no significant elevation of inflammatory markers to suggest active infection. -Continue to monitor for signs of infection -Return precautions given, contact office if signs of infection develop Return in about 3 weeks (around 02/21/2024) for Wound Care.  Ethan LITTIE Saddler, DPM      [facility] [place] LOCATION: [address] [place] [address]                   Office [phone]   [place] LOCATION: [address], [address] [place], [place] [address] Office [phone]    "

## 2024-02-21 ENCOUNTER — Ambulatory Visit: Admitting: Podiatry

## 2024-02-28 ENCOUNTER — Ambulatory Visit: Admitting: Podiatry

## 2024-05-21 ENCOUNTER — Ambulatory Visit (HOSPITAL_COMMUNITY)
